# Patient Record
Sex: Female | Born: 1965
Health system: Southern US, Community
[De-identification: ages and names within clinical notes are randomized; demographics above are authoritative.]

## PROBLEM LIST (undated history)

## (undated) DIAGNOSIS — R112 Nausea with vomiting, unspecified: Secondary | ICD-10-CM

## (undated) DIAGNOSIS — R0600 Dyspnea, unspecified: Secondary | ICD-10-CM

## (undated) DIAGNOSIS — M1990 Unspecified osteoarthritis, unspecified site: Secondary | ICD-10-CM

## (undated) DIAGNOSIS — K219 Gastro-esophageal reflux disease without esophagitis: Secondary | ICD-10-CM

## (undated) DIAGNOSIS — Z9889 Other specified postprocedural states: Secondary | ICD-10-CM

## (undated) DIAGNOSIS — D649 Anemia, unspecified: Secondary | ICD-10-CM

## (undated) HISTORY — PX: OTHER SURGICAL HISTORY: SHX169

---

## 2001-01-13 ENCOUNTER — Other Ambulatory Visit: Admission: RE | Admit: 2001-01-13 | Discharge: 2001-01-13 | Payer: Self-pay | Admitting: Obstetrics and Gynecology

## 2002-05-13 ENCOUNTER — Other Ambulatory Visit: Admission: RE | Admit: 2002-05-13 | Discharge: 2002-05-13 | Payer: Self-pay | Admitting: *Deleted

## 2002-05-13 ENCOUNTER — Other Ambulatory Visit: Admission: RE | Admit: 2002-05-13 | Discharge: 2002-05-13 | Payer: Self-pay | Admitting: Obstetrics and Gynecology

## 2002-11-29 ENCOUNTER — Inpatient Hospital Stay (HOSPITAL_COMMUNITY): Admission: AD | Admit: 2002-11-29 | Discharge: 2002-12-02 | Payer: Self-pay | Admitting: Obstetrics and Gynecology

## 2002-11-29 ENCOUNTER — Inpatient Hospital Stay (HOSPITAL_COMMUNITY): Admission: AD | Admit: 2002-11-29 | Discharge: 2002-11-29 | Payer: Self-pay | Admitting: *Deleted

## 2004-12-28 ENCOUNTER — Other Ambulatory Visit: Admission: RE | Admit: 2004-12-28 | Discharge: 2004-12-28 | Payer: Self-pay | Admitting: Obstetrics and Gynecology

## 2005-01-08 ENCOUNTER — Emergency Department (HOSPITAL_COMMUNITY): Admission: EM | Admit: 2005-01-08 | Discharge: 2005-01-08 | Payer: Self-pay | Admitting: Podiatry

## 2005-04-15 ENCOUNTER — Inpatient Hospital Stay (HOSPITAL_COMMUNITY): Admission: AD | Admit: 2005-04-15 | Discharge: 2005-04-15 | Payer: Self-pay | Admitting: Obstetrics and Gynecology

## 2005-04-15 ENCOUNTER — Ambulatory Visit: Payer: Self-pay | Admitting: Obstetrics and Gynecology

## 2005-04-15 ENCOUNTER — Inpatient Hospital Stay (HOSPITAL_COMMUNITY): Admission: AD | Admit: 2005-04-15 | Discharge: 2005-04-17 | Payer: Self-pay | Admitting: Family Medicine

## 2005-12-23 HISTORY — PX: CHOLECYSTECTOMY: SHX55

## 2006-06-17 ENCOUNTER — Ambulatory Visit (HOSPITAL_COMMUNITY): Admission: RE | Admit: 2006-06-17 | Discharge: 2006-06-17 | Payer: Self-pay | Admitting: Gastroenterology

## 2006-06-20 ENCOUNTER — Ambulatory Visit (HOSPITAL_COMMUNITY): Admission: RE | Admit: 2006-06-20 | Discharge: 2006-06-20 | Payer: Self-pay | Admitting: Gastroenterology

## 2006-10-09 ENCOUNTER — Ambulatory Visit (HOSPITAL_COMMUNITY): Admission: RE | Admit: 2006-10-09 | Discharge: 2006-10-10 | Payer: Self-pay | Admitting: General Surgery

## 2006-10-09 ENCOUNTER — Encounter (INDEPENDENT_AMBULATORY_CARE_PROVIDER_SITE_OTHER): Payer: Self-pay | Admitting: Specialist

## 2006-12-23 HISTORY — PX: TUBAL LIGATION: SHX77

## 2007-04-26 ENCOUNTER — Inpatient Hospital Stay (HOSPITAL_COMMUNITY): Admission: AD | Admit: 2007-04-26 | Discharge: 2007-04-29 | Payer: Self-pay | Admitting: Obstetrics and Gynecology

## 2007-04-26 ENCOUNTER — Encounter (INDEPENDENT_AMBULATORY_CARE_PROVIDER_SITE_OTHER): Payer: Self-pay | Admitting: Specialist

## 2007-04-28 ENCOUNTER — Encounter (INDEPENDENT_AMBULATORY_CARE_PROVIDER_SITE_OTHER): Payer: Self-pay | Admitting: Specialist

## 2008-03-14 ENCOUNTER — Ambulatory Visit (HOSPITAL_COMMUNITY): Admission: RE | Admit: 2008-03-14 | Discharge: 2008-03-14 | Payer: Self-pay | Admitting: Neurology

## 2008-04-11 ENCOUNTER — Encounter: Admission: RE | Admit: 2008-04-11 | Discharge: 2008-04-11 | Payer: Self-pay | Admitting: Gastroenterology

## 2008-11-13 ENCOUNTER — Emergency Department (HOSPITAL_COMMUNITY): Admission: EM | Admit: 2008-11-13 | Discharge: 2008-11-13 | Payer: Self-pay | Admitting: Emergency Medicine

## 2009-05-07 ENCOUNTER — Ambulatory Visit (HOSPITAL_COMMUNITY): Admission: RE | Admit: 2009-05-07 | Discharge: 2009-05-07 | Payer: Self-pay | Admitting: Internal Medicine

## 2009-09-01 ENCOUNTER — Ambulatory Visit (HOSPITAL_COMMUNITY): Admission: RE | Admit: 2009-09-01 | Discharge: 2009-09-01 | Payer: Self-pay | Admitting: Gastroenterology

## 2009-10-09 ENCOUNTER — Ambulatory Visit: Payer: Self-pay | Admitting: Radiology

## 2009-10-09 ENCOUNTER — Emergency Department (HOSPITAL_BASED_OUTPATIENT_CLINIC_OR_DEPARTMENT_OTHER): Admission: EM | Admit: 2009-10-09 | Discharge: 2009-10-09 | Payer: Self-pay | Admitting: Emergency Medicine

## 2009-10-29 ENCOUNTER — Emergency Department (HOSPITAL_COMMUNITY): Admission: EM | Admit: 2009-10-29 | Discharge: 2009-10-29 | Payer: Self-pay | Admitting: Emergency Medicine

## 2010-02-14 ENCOUNTER — Encounter: Admission: RE | Admit: 2010-02-14 | Discharge: 2010-02-14 | Payer: Self-pay | Admitting: Obstetrics and Gynecology
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
## 2011-01-13 ENCOUNTER — Encounter: Payer: Self-pay | Admitting: Neurology

## 2011-02-02 ENCOUNTER — Inpatient Hospital Stay (INDEPENDENT_AMBULATORY_CARE_PROVIDER_SITE_OTHER)
Admission: RE | Admit: 2011-02-02 | Discharge: 2011-02-02 | Disposition: A | Discharge status: Home / Self Care | Payer: Self-pay | Source: Ambulatory Visit | Attending: Emergency Medicine | Admitting: Emergency Medicine

## 2011-02-02 DIAGNOSIS — H6690 Otitis media, unspecified, unspecified ear: Secondary | ICD-10-CM

## 2011-04-05 ENCOUNTER — Other Ambulatory Visit: Payer: Self-pay | Admitting: Obstetrics and Gynecology

## 2011-04-05 DIAGNOSIS — Z1231 Encounter for screening mammogram for malignant neoplasm of breast: Secondary | ICD-10-CM

## 2011-04-25 ENCOUNTER — Ambulatory Visit: Payer: Self-pay

## 2011-05-10 NOTE — H&P (Signed)
NAME:  Cheryl Bell, Cheryl Bell                         ACCOUNT NO.:  [id number]   MEDICAL RECORD NO.:  [id number]                   PATIENT TYPE:  MAT   LOCATION:  [facility]                                 FACILITY:  [facility]   PHYSICIAN:  Hal Morales, M.D.             DATE OF BIRTH:  [birth year]   DATE OF ADMISSION:  11/29/2002  DATE OF DISCHARGE:                                HISTORY & PHYSICAL   HISTORY OF PRESENT ILLNESS:  This is a 45 year old gravida 6, para 2-0-3-1  at 40-3/7 weeks, who presents with complaints of increased uterine  contractions over the last two to three hours.  She was seen earlier for  labor evaluation with a cervix stable at 3 cm.   PRESENT PREGNANCY:  Her pregnancy has been followed by Cheryl Bell and  remarkable for:  1. History of macrosomia.  2. History of three abortions.  3. History of migraines.  4. Group B strep positive.  5. History of a child who died of typhoid fever in [place].   Her pregnancy has been otherwise unremarkable, except for a positive Group B  strep.   OBSTETRICAL HISTORY:  Remarkable for vaginal delivery in 1991 of a female  infant at term, weighing 7 pounds 6 ounces with no complications.  She had a  vaginal delivery in 1993 of a female infant at 9+ pounds who later died of  typhoid fever.  She had an elective abortion in an unknown year.  She had an  elective abortion again in 2000, and an elective abortion in 2001; all of  these were between 8 and [redacted] weeks gestation.   PAST MEDICAL HISTORY:  1. History of anemia.  2. History of a questionable hemorrhage with her first delivery.  3. History of hyperemesis with her first pregnancy.  4. History of gonorrhea 12 years ago, with none since.  5. History of varicella in 1993.  6. History of migraine headaches.   PAST SURGICAL HISTORY:  Elective abortions x3.   FAMILY HISTORY:  Father with heart disease and hypertension.  A sister with  hypertension; a sister with a blood clot in her  leg.   GENETIC HISTORY:  Remarkable for the father of the baby being 34 years old.  The father of the baby's uncle with mental retardation.  Father of the  baby's first cousin with sickle cell disease; and, the father of the baby's  uncle who is a twin.   SOCIAL HISTORY:  The patient is single, but involved with Cheryl Bell who  is present and supportive.  She is of the [place] faith.  She denies any  alcohol, tobacco or drug use.   OBJECTIVE DATA:  VITAL SIGNS:  Stable, afebrile.  HEENT:  Within normal limits.  NECK:  Thyroid normal, not enlarged.  CHEST:  Clear to auscultation.  HEART:  Regular rate and rhythm.  ABDOMEN:  Gravid at 40 cm; vertex to  Thayer Ohm.  AFM shows fetal heart rate of  140 baseline, with accelerations to 160.  Uterine contractions every 3-6  mins, which are moderate to strong.  PELVIC:  Cervical examination is 4+ cm, 100% effaced, -1 station with a  vertex presentation and bulging membranes.  There is old brown blood in the  vaginal vault.  EXTREMITIES:  Within normal limits.   ASSESSMENT:  1. Intrauterine pregnancy at 40-3/7 weeks.  2. Active labor.  3. Group B strep positive.   PLAN:  1. Admit to [facility], per Cheryl Bell.  2. Routine MD orders.  3. Penicillin prophylaxis.  4. Anticipate SVD.     Marie L. Opie, C.N.M.                 Hal Morales, M.D.    MLW/MEDQ  D:  11/30/2002  T:  11/30/2002  Job:  [id number]

## 2011-05-10 NOTE — Op Note (Signed)
NAME:  Cheryl Bell, Cheryl Bell               ACCOUNT NO.:  [id number]   MEDICAL RECORD NO.:  [id number]          PATIENT TYPE:  INP   LOCATION:  9110                          FACILITY:  [facility]   PHYSICIAN:  Michelle L. Grewal, M.D.DATE OF BIRTH:  [birth year]   DATE OF PROCEDURE:  04/28/2007  DATE OF DISCHARGE:                               OPERATIVE REPORT   PREOPERATIVE DIAGNOSES:  Postpartum, desires permanent sterilization.   POSTOPERATIVE DIAGNOSES:  Postpartum, desires permanent sterilization.   OPERATIVE PROCEDURE:  Modified Pomeroy bilateral tubal ligation  postpartum.   SURGEON:  Michelle L. Vincente Poli, M.D.   ASSISTANT:  Not applicable.   ANESTHESIA:  Epidural.   SPECIMENS:  Fallopian tube segments.   PATHOLOGY:  Sent to path.   ESTIMATED BLOOD LOSS:  Minimal.   COMPLICATIONS:  None.   DESCRIPTION OF PROCEDURE:  The patient was taken to the operating room  after informed consent was obtained.  She was prepped and draped and her  epidural was dosed and found to be adequate.  A small transverse  incision was made at the umbilicus and then the fascia was identified  and grasped using Allis clamps.  The fascia was entered using Mayo  scissors in standard fashion.  We entered the peritoneum.  I then  identified the right fallopian tube, followed it out to the fimbriated  end, grasped the mid portion using a Babcock clamp and tied off a 3 cm  knuckle x2 with plain gut suture x2.  The knuckle of tissue was excised  using Metzenbaum scissors with excellent hemostasis and it was sent to  pathology.  The tube was returned to the abdomen.  The tubal ligation  was performed on the left side in an identical fashion and that tubal  segment was sent off to pathology.  It appeared grossly that bot tubes  were completely transected.  The fascia was closed using 0 Vicryl in a  running stitch.  The subcuticular was closed with 3-0 Vicryl  subcuticular and Dermabond was applied to the skin.  All  sponge, lap and  instrument counts were correct x2.  The patient went to the recovery  room in stable condition.      Michelle L. Vincente Poli, M.D.  Electronically Signed    MLG/MEDQ  D:  04/28/2007  T:  04/28/2007  Job:  [id number]

## 2011-05-10 NOTE — Op Note (Signed)
NAMEALMA, MOHIUDDIN               ACCOUNT NO.:  [id number]   MEDICAL RECORD NO.:  [id number]          PATIENT TYPE:  OIB   LOCATION:  5712                         FACILITY:  [facility]   PHYSICIAN:  Cherylynn Ridges, M.D.    DATE OF BIRTH:  1966/02/17   DATE OF PROCEDURE:  10/09/2006  DATE OF DISCHARGE:  10/10/2006                                 OPERATIVE REPORT   PREOPERATIVE DIAGNOSIS:  Symptomatic cholelithiasis.   POSTOPERATIVE DIAGNOSIS:  Symptomatic cholelithiasis.   PROCEDURE:  Laparoscopic cholecystectomy with cholangiogram.   SURGEON:  Cherylynn Ridges, M.D.   ASSISTANT:  Anselm Pancoast. Zachery Dakins, M.D.   ANESTHESIA:  General endotracheal.   ESTIMATED BLOOD LOSS:  Less than 20 cc.   COMPLICATIONS:  None.   CONDITION:  Stable.   INDICATIONS FOR OPERATION:  The patient is a 45 year old with symptomatic  gallstones, who now comes in for a lap chole.   FINDINGS:  The cholangiogram was normal with no evidence of ductal  dilatation, filling defects or obstruction of flow.   OPERATION:  The patient was taken to the operating room and placed on the  table in supine position.  After an adequate endotracheal anesthetic was  administered, she was prepped and draped in the usual sterile manner,  exposing the midline in the right upper quadrant.   A supraumbilical curvilinear incision was made using a #11 blade and taken  down to the midline fascia.  We grasped the midline fascia using Kocher  clamps and made an incision between those 2 clamps using a #15 blade down to  the preperitoneal area.  We bluntly dissected down into the peritoneal  cavity with a Kelly clamp, and then passed a pursestring suture of 0 Vicryl,  which held in the Hasson cannula, which was subsequently passed.  With the  Hasson cannula in place, we insufflated with carbon dioxide gas up to a  maximum intra-abdominal pressure of 15 mmHg.  Two right costal margin 5-mm  cannulae, and a subxiphoid 12-mm cannula were  passed under direct vision.  Once all cannulae were in place, the patient was placed in reverse  Trendelenburg. The left side was tilted down and the dissection begun.   The gallbladder was retracted towards the right upper quadrant and the  anterior abdominal wall using a grabber through the lateral-most 5-mm  cannula.  A second grasper was attached onto the infundibulum.  Then, we  dissected out the peritoneum overlying the triangle of Calot and the  hepatoduodenal triangle, exposing the cystic duct and the cystic artery.  The patient had a very prominent right hepatic artery, which was not injured  during the case.   We dissected out the cystic duct and placed a clip along the gallbladder  side.  We made a cholecystodochotomy using laparoscopic scissors, and then  passed a Cook catheter through the anterior abdominal wall into the  cholecystodochotomy in order to perform a cholangiogram.  This showed good  flow into the duodenum, no obstruction, no filling defects and good proximal  filling.   Once the cholangiogram was complete, we removed  it from the cystic duct  distally, placed 3 distal clips along the cystic duct side, then transected  the cystic duct.  Two branches of the cystic artery were subsequently found  in the triangle of Calot, which were clipped proximally and distally and  transected.  We then dissected out the gallbladder from its bed with minimal  difficulty, although we did enter into the gallbladder, spilling bile, but  no stones.   Once the gallbladder was completely removed from the hepatic bed, then we  obtained hemostasis using electrocautery.  We used an EndoCatch bag to  remove the gallbladder from the supraumbilical site with minimal difficulty.  We then irrigated with about 1 L to 1.5 L of warm saline solution, making  sure all bleeding had stopped.  Once this was done, we aspirated above the  liver.  All fluid and gas were removed.  All cannulae were  then closed.   The supraumbilical fascial site was closed using the pursestring suture,  which was in place.  And the skin at all sites was closed using a running,  subcuticular stitch of 4-0 Vicryl.  Marcaine 0.25% with epi was injected at  all sites prior to closure.  A sterile dressing was applied.  All counts  were correct.      Cherylynn Ridges, M.D.  Electronically Signed     JOW/MEDQ  D:  10/09/2006  T:  10/10/2006  Job:  [id number]   cc:   Olene Craven, M.D.  Dr Sharlett Iles. Zachery Dakins, M.D.

## 2011-06-27 ENCOUNTER — Ambulatory Visit
Admission: RE | Admit: 2011-06-27 | Discharge: 2011-06-27 | Disposition: A | Discharge status: Home / Self Care | Payer: Commercial Managed Care - PPO | Source: Ambulatory Visit | Attending: Obstetrics and Gynecology | Admitting: Obstetrics and Gynecology

## 2011-06-27 DIAGNOSIS — Z1231 Encounter for screening mammogram for malignant neoplasm of breast: Secondary | ICD-10-CM

## 2011-09-25 LAB — COMPREHENSIVE METABOLIC PANEL
BUN: 8
CO2: 26
Chloride: 106
Creatinine, Ser: 0.75
GFR calc non Af Amer: >60
Glucose, Bld: 113 — ABNORMAL HIGH
Total Bilirubin: 0.6

## 2011-09-25 LAB — URINALYSIS, ROUTINE W REFLEX MICROSCOPIC
Glucose, UA: NEGATIVE
Hgb urine dipstick: NEGATIVE
Protein, ur: NEGATIVE

## 2011-09-25 LAB — CBC
HCT: 42.2
MCV: 94
Platelets: 178
WBC: 8.2

## 2011-09-25 LAB — DIFFERENTIAL
Basophils Absolute: 0
Lymphocytes Relative: 14
Neutro Abs: 6.4
Neutrophils Relative %: 78 — ABNORMAL HIGH

## 2012-02-24 ENCOUNTER — Other Ambulatory Visit (HOSPITAL_COMMUNITY): Payer: Self-pay | Admitting: Internal Medicine

## 2012-02-28 ENCOUNTER — Other Ambulatory Visit (HOSPITAL_COMMUNITY): Payer: Commercial Managed Care - PPO

## 2012-03-02 ENCOUNTER — Other Ambulatory Visit (HOSPITAL_COMMUNITY): Payer: Commercial Managed Care - PPO

## 2012-03-09 ENCOUNTER — Ambulatory Visit (HOSPITAL_COMMUNITY)
Admission: RE | Admit: 2012-03-09 | Discharge: 2012-03-09 | Disposition: A | Discharge status: Home / Self Care | Payer: 59 | Source: Ambulatory Visit | Attending: Internal Medicine | Admitting: Internal Medicine

## 2012-03-09 DIAGNOSIS — R1310 Dysphagia, unspecified: Secondary | ICD-10-CM | POA: Insufficient documentation

## 2012-06-05 ENCOUNTER — Other Ambulatory Visit: Payer: Self-pay | Admitting: Specialist

## 2012-06-05 ENCOUNTER — Ambulatory Visit
Admission: RE | Admit: 2012-06-05 | Discharge: 2012-06-05 | Disposition: A | Discharge status: Home / Self Care | Payer: 59 | Source: Ambulatory Visit | Attending: Specialist | Admitting: Specialist

## 2012-06-05 DIAGNOSIS — R7611 Nonspecific reaction to tuberculin skin test without active tuberculosis: Secondary | ICD-10-CM

## 2012-08-19 ENCOUNTER — Other Ambulatory Visit: Payer: Self-pay | Admitting: Obstetrics and Gynecology

## 2012-08-19 DIAGNOSIS — Z1231 Encounter for screening mammogram for malignant neoplasm of breast: Secondary | ICD-10-CM

## 2012-08-25 ENCOUNTER — Ambulatory Visit: Payer: 59

## 2012-09-28 ENCOUNTER — Ambulatory Visit
Admission: RE | Admit: 2012-09-28 | Discharge: 2012-09-28 | Disposition: A | Discharge status: Home / Self Care | Payer: 59 | Source: Ambulatory Visit | Attending: Obstetrics and Gynecology | Admitting: Obstetrics and Gynecology

## 2012-09-28 DIAGNOSIS — Z1231 Encounter for screening mammogram for malignant neoplasm of breast: Secondary | ICD-10-CM

## 2012-11-13 ENCOUNTER — Other Ambulatory Visit: Payer: Self-pay | Admitting: Obstetrics and Gynecology

## 2013-07-28 ENCOUNTER — Other Ambulatory Visit (HOSPITAL_BASED_OUTPATIENT_CLINIC_OR_DEPARTMENT_OTHER): Payer: Self-pay | Admitting: Specialist

## 2013-07-28 ENCOUNTER — Ambulatory Visit (HOSPITAL_BASED_OUTPATIENT_CLINIC_OR_DEPARTMENT_OTHER)
Admission: RE | Admit: 2013-07-28 | Discharge: 2013-07-28 | Disposition: A | Discharge status: Home / Self Care | Payer: 59 | Source: Ambulatory Visit | Attending: Specialist | Admitting: Specialist

## 2013-07-28 DIAGNOSIS — A159 Respiratory tuberculosis unspecified: Secondary | ICD-10-CM

## 2013-07-28 DIAGNOSIS — R7611 Nonspecific reaction to tuberculin skin test without active tuberculosis: Secondary | ICD-10-CM | POA: Insufficient documentation

## 2014-01-11 ENCOUNTER — Other Ambulatory Visit: Payer: Self-pay

## 2014-08-10 ENCOUNTER — Other Ambulatory Visit: Payer: Self-pay | Admitting: Obstetrics and Gynecology

## 2014-08-11 LAB — CYTOLOGY - PAP

## 2014-09-16 ENCOUNTER — Ambulatory Visit: Payer: 59 | Admitting: Family

## 2014-09-16 ENCOUNTER — Telehealth: Payer: Self-pay | Admitting: *Deleted

## 2014-09-16 DIAGNOSIS — Z0289 Encounter for other administrative examinations: Secondary | ICD-10-CM

## 2014-09-16 NOTE — Telephone Encounter (Signed)
Please do not reschedule patient with me due to no show.

## 2014-09-16 NOTE — Telephone Encounter (Signed)
Pt did not show for appointment 09/16/2014 at 9am to establish care

## 2014-10-21 ENCOUNTER — Ambulatory Visit: Payer: 59 | Admitting: Medical

## 2015-01-26 ENCOUNTER — Encounter (HOSPITAL_COMMUNITY): Payer: Self-pay | Admitting: *Deleted

## 2015-01-26 ENCOUNTER — Emergency Department (HOSPITAL_COMMUNITY)
Admission: EM | Admit: 2015-01-26 | Discharge: 2015-01-26 | Disposition: A | Discharge status: Home / Self Care | Payer: 59 | Source: Home / Self Care | Attending: Emergency Medicine | Admitting: Emergency Medicine

## 2015-01-26 DIAGNOSIS — B029 Zoster without complications: Secondary | ICD-10-CM

## 2015-01-26 LAB — POCT URINALYSIS DIP (DEVICE)
Bilirubin Urine: NEGATIVE
Glucose, UA: NEGATIVE mg/dL
Hgb urine dipstick: NEGATIVE
KETONES UR: NEGATIVE mg/dL
Leukocytes, UA: NEGATIVE
NITRITE: NEGATIVE
Protein, ur: NEGATIVE mg/dL
Specific Gravity, Urine: 1.01 (ref 1.005–1.030)
UROBILINOGEN UA: 0.2 mg/dL (ref 0.0–1.0)
pH: 6.5 (ref 5.0–8.0)

## 2015-01-26 LAB — POCT PREGNANCY, URINE: PREG TEST UR: NEGATIVE

## 2015-01-26 MED ORDER — VALACYCLOVIR HCL 1 G PO TABS
1000.0000 mg | ORAL_TABLET | Freq: Three times a day (TID) | ORAL | Status: AC
Start: 2015-01-26 — End: 2015-02-09

## 2015-01-26 NOTE — ED Notes (Signed)
Pt  Reports  l  flank  Pain   With  Pain  Radiating  To llq  With  Onset        sev  Days  Ago       Nausea   No  Vomiting  Ambulated  Upright  With a  steady  Fluid  Gait   Alert  And  Oriented

## 2015-01-26 NOTE — Discharge Instructions (Signed)

## 2015-01-26 NOTE — ED Provider Notes (Signed)
   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 She's had a nine-day history of pain in her left flank which has radiated to her left upper quadrant of her abdomen. The pain is constant. It is burning in nature. She has skin sensitivity and the pain is rated a 5/10 in intensity and is worse with movement. She felt slightly nauseated and may have lost a couple pounds. No fever, chills, or vomiting. No constipation, diarrhea, blood in the stool. No urinary or GYN symptoms. She has had chickenpox as a child. She's noted a small patch of itchy bumps in the left CVA area with the pain first began.  Review of Systems   Other than as noted above, the patient denies any of the following symptoms: Constitutional:  No fever, chills, weight loss or anorexia. Abdomen:  No nausea, vomiting, hematememesis, melena, diarrhea, or hematochezia. GU:  No dysuria, frequency, urgency, or hematuria. Gyn:  No vaginal discharge, itching, abnormal bleeding, dyspareunia, or pelvic pain.  [place]   Past medical history, family history, social history, meds, and allergies were reviewed. She is intolerant to Vicodin. She takes meloxicam and Prilosec. She has GERD.  Physical Exam     Vital signs:  BP 116/72 mmHg  Pulse 67  Temp(Src) 98.2 F (36.8 C) (Oral)  Resp 17  SpO2 100%  LMP 01/16/2015 Gen:  Alert, oriented, in no distress. Lungs:  Breath sounds clear and equal bilaterally.  No wheezes, rales or rhonchi. Heart:  Regular rhythm.  No gallops or murmers.   Abdomen:  Soft, flat, nondistended. No organomegaly or mass. Bowel sounds are normal. No tenderness to palpation, guarding, or rebound. No CVA tenderness. Skin:  Clear, warm and dry.  There is a patchy erythematous bumps in the left CVA area, where the pain seems to originate from.  Labs   Results for orders placed or performed during the hospital  encounter of 01/26/15  POCT urinalysis dip (device)  Result Value Ref Range   Glucose, UA NEGATIVE NEGATIVE mg/dL   Bilirubin Urine NEGATIVE NEGATIVE   Ketones, ur NEGATIVE NEGATIVE mg/dL   Specific Gravity, Urine 1.010 1.005 - 1.030   Hgb urine dipstick NEGATIVE NEGATIVE   pH 6.5 5.0 - 8.0   Protein, ur NEGATIVE NEGATIVE mg/dL   Urobilinogen, UA 0.2 0.0 - 1.0 mg/dL   Nitrite NEGATIVE NEGATIVE   Leukocytes, UA NEGATIVE NEGATIVE  Pregnancy, urine POC  Result Value Ref Range   Preg Test, Ur NEGATIVE NEGATIVE   Assessment   The encounter diagnosis was Shingles.  Plan     1.  Meds:  The following meds were prescribed:   New Prescriptions   VALACYCLOVIR (VALTREX) 1000 MG TABLET    Take 1 tablet (1,000 mg total) by mouth 3 (three) times daily.    2.  Patient Education/Counseling:  The patient was given appropriate handouts, self care instructions, and instructed in symptomatic relief.  She was instructed to take infectious precautions.  3.  Follow up:  The patient was told to follow up here if no better in 3 to 4 days, or sooner if becoming worse in any way, and given some red flag symptoms such as worsening pain, fever, vomiting, or evidence of GI bleeding which would prompt immediate return.     Harden Mo, MD 01/26/15 [phone]

## 2016-03-20 MED FILL — traMADol HCL 50 MG TABS: 50 | 4 days supply | Qty: 16 | Fill #0

## 2016-03-20 MED FILL — AMOXICILLIN 500 MG CAPSULE: 500 | 7 days supply | Qty: 21 | Fill #0

## 2017-01-10 MED FILL — AZITHROMYCIN 500 MG TABLET: 500 | 3 days supply | Qty: 3 | Fill #0

## 2017-01-10 MED FILL — ATOVAQUONE-PROGUANIL 250-10: 250-100 | 20 days supply | Qty: 20 | Fill #0

## 2017-01-10 MED FILL — ONDANSETRON HCL 4 MG TABLET: 4 | 4 days supply | Qty: 18 | Fill #0

## 2017-05-28 ENCOUNTER — Other Ambulatory Visit: Payer: Self-pay | Admitting: Internal Medicine

## 2017-05-28 ENCOUNTER — Ambulatory Visit
Admission: RE | Admit: 2017-05-28 | Discharge: 2017-05-28 | Disposition: A | Discharge status: Home / Self Care | Payer: BC Managed Care – PPO | Source: Ambulatory Visit | Attending: Internal Medicine | Admitting: Internal Medicine

## 2017-05-28 DIAGNOSIS — Z9289 Personal history of other medical treatment: Secondary | ICD-10-CM

## 2018-03-13 MED FILL — SM ANTI-DIARRHEAL 2 MG CAPL: 2 | 12 days supply | Qty: 48 | Fill #0

## 2018-03-13 MED FILL — CARAFATE 1 GM/10 ML SUSP: 1 | 11 days supply | Qty: 420 | Fill #0

## 2018-03-13 MED FILL — ONDANSETRON HCL 4 MG TABLET: 4 | 21 days supply | Qty: 18 | Fill #0

## 2018-04-27 ENCOUNTER — Encounter: Payer: Self-pay | Admitting: Nurse Practitioner

## 2018-05-29 NOTE — Progress Notes (Signed)
Office Visit Note  Patient: Cheryl Bell             Date of Birth: 12/11/1966           MRN: [id number]             PCP: Minette Brine Referring: Minette Brine, FNP Visit Date: 06/10/2018 Occupation: RN at [employer]    Subjective:  Pain in multiple joints and positive rheumatoid factor  History of Present Illness: Cheryl Bell is a 52 y.o. female seen in consultation per request of her PCP.  According to patient her symptoms started in March 2016 with increased joint swelling.  At the time her PCP from positive rheumatoid factor and referred her to me.  She came for one time visit at the time she was found to have synovitis.  She did not came to come for follow-up visit after that.  She also had positive TB Gold which was treated at [facility] department according to patient with rifampin.  I do not have those records to confirm.  She states she continued to have joint pain and she avoided certain foods which helped.  She also took over-the-counter anti-inflammatories for pain relief.  She continues to have pain and discomfort in her bilateral wrist joints, bilateral hands, bilateral knee joints, bilateral ankles and her feet.  She states her ankles are mostly swollen in the evening after work.  She works as a Therapist, sports at [employer].  She was recently seen by her PCP and had labs which were positive for rheumatoid factor and she was referred again to me.  Activities of Daily Living:  Patient reports morning stiffness for 10 minutes.   Patient Denies nocturnal pain.  Difficulty dressing/grooming: Denies Difficulty climbing stairs: Denies Difficulty getting out of chair: Denies Difficulty using hands for taps, buttons, cutlery, and/or writing: Denies   Review of Systems  Constitutional: Negative for fatigue.  HENT: Negative for mouth sores, trouble swallowing, trouble swallowing, mouth dryness and nose dryness.   Eyes: Negative for pain, visual disturbance and dryness.  Respiratory:  Negative for cough, hemoptysis, shortness of breath and difficulty breathing.   Cardiovascular: Negative for chest pain, palpitations, hypertension and swelling in legs/feet.  Gastrointestinal: Negative for blood in stool, constipation and diarrhea.  Endocrine: Negative for increased urination.  Genitourinary: Negative for painful urination.  Musculoskeletal: Positive for arthralgias, joint pain, joint swelling and morning stiffness. Negative for myalgias, muscle weakness, muscle tenderness and myalgias.  Skin: Positive for hair loss. Negative for color change, pallor, rash, nodules/bumps, skin tightness, ulcers and sensitivity to sunlight.  Allergic/Immunologic: Negative for susceptible to infections.  Neurological: Negative for dizziness, numbness, headaches and weakness.  Hematological: Negative for swollen glands.  Psychiatric/Behavioral: Negative for depressed mood and sleep disturbance. The patient is not nervous/anxious.     PMFS History:  There are no active problems to display for this patient.   History reviewed. No pertinent past medical history.  Family History  Problem Relation Age of Onset  . Prostate cancer Brother   . Healthy Son   . Healthy Son   . Healthy Daughter   . Healthy Daughter    Past Surgical History:  Procedure Laterality Date  . BTL    . CHOLECYSTECTOMY  2007  . TUBAL LIGATION  2008   Social History   Social History Narrative  . Not on file     Objective: Vital Signs: BP 120/74 (BP Location: Left Arm, Patient Position: Sitting, Cuff Size: Normal)  Pulse (!) 58   Resp 16   Ht 5\' 6"  (1.676 m)   Wt 188 lb 8 oz (85.5 kg)   BMI 30.42 kg/m    Physical Exam  Constitutional: She is oriented to person, place, and time. She appears well-developed and well-nourished.  HENT:  Head: Normocephalic and atraumatic.  Eyes: Conjunctivae and EOM are normal.  Neck: Normal range of motion.  Cardiovascular: Normal rate, regular rhythm, normal heart sounds  and intact distal pulses.  Pulmonary/Chest: Effort normal and breath sounds normal.  Abdominal: Soft. Bowel sounds are normal.  Lymphadenopathy:    She has no cervical adenopathy.  Neurological: She is alert and oriented to person, place, and time.  Skin: Skin is warm and dry. Capillary refill takes less than 2 seconds.  Psychiatric: She has a normal mood and affect. Her behavior is normal.  Nursing note and vitals reviewed.    Musculoskeletal Exam: C-spine thoracic lumbar spine good range of motion.  Shoulder joints elbow joints were good range of motion.  Wrist joints were in good range of motion.  She has synovitis over bilateral second and third MCP joints.  No PIP and DIP swelling was noted.  Hip joints knee joints but good range of motion.  She has limited range of motion of ankle joints with subluxation.  Left MTP was swollen and tender.  CDAI Exam: CDAI Homunculus Exam:   Tenderness:  Right hand: 2nd MCP and 3rd MCP Left hand: 2nd MCP and 3rd MCP RLE: tibiotalar LLE: tibiotalar  Swelling:  Right hand: 2nd MCP and 3rd MCP Left hand: 2nd MCP and 3rd MCP RLE: tibiotalar LLE: tibiotalar  Joint Counts:  CDAI Tender Joint count: 4 CDAI Swollen Joint count: 4  Global Assessments:  Patient Global Assessment: 7 Provider Global Assessment: 5  CDAI Calculated Score: 20    Investigation: Findings:  04/22/18: Uric acid 4.3, RF 337.9, ANA negative, dsDNA <1, C3 168, HIV nonreactive, T pallidum negative, Vit D 23.5, RSh 1.220, TB gold negative, lipids WNL CBC normal, CMP normal  CBC    Component Value Date/Time   WBC 8.2 11/13/2008 1000   RBC 4.49 11/13/2008 1000   HGB 14.3 11/13/2008 1000   HCT 42.2 11/13/2008 1000   PLT 178 11/13/2008 1000   MCV 94.0 11/13/2008 1000   MCHC 33.7 11/13/2008 1000   RDW 12.5 11/13/2008 1000   LYMPHSABS 1.1 11/13/2008 1000   MONOABS 0.6 11/13/2008 1000   EOSABS 0.0 11/13/2008 1000   BASOSABS 0.0 11/13/2008 1000   CMP 11/13/2008    Glucose 113(H)  BUN 8  Creatinine 0.75  Sodium 138  Potassium 3.5  Chloride 106  CO2 26  Calcium 8.9  Total Protein 7.3  Total Bilirubin 0.6  Alkaline Phos 44  AST 21  ALT 18      Imaging: Xr Ankle 2 Views Left  Result Date: 06/10/2018 No tibiotalar joint space narrowing was noted.  Subtalar joint space narrowing was noted.  No erosive changes were noted.  Xr Ankle 2 Views Right  Result Date: 06/10/2018 No tibiotalar joint space narrowing was noted.  Subtalar joint space narrowing was noted.  No erosive changes were noted.  Xr Foot 2 Views Left  Result Date: 06/10/2018 First MTP, PIP and DIP joint space narrowing was noted.  No erosive changes were noted.  No intertarsal joint space narrowing was noted.  Subtalar joint space narrowing was noted. Impression: These findings are consistent with inflammatory arthritis.  Xr Foot 2 Views Right  Result Date:  06/10/2018 First MTP, PIP and DIP joint space narrowing was noted.  No erosive changes were noted.  No intertarsal joint space narrowing was noted.  Subtalar joint space narrowing was noted. Impression: These findings are consistent with inflammatory arthritis.  Xr Hand 2 View Left  Result Date: 06/10/2018 Juxta-articular osteopenia was noted.  Mild third MCP narrowing was noted.  PIP narrowing was noted.  No intercarpal radiocarpal joint space narrowing was noted.  No erosive changes were noted. Impression: These findings were consistent with mild osteoarthritis and rheumatoid arthritis overlap.  Xr Hand 2 View Right  Result Date: 06/10/2018 Juxta-articular osteopenia was noted.  Mild third MCP narrowing was noted.  PIP narrowing was noted.  No intercarpal radiocarpal joint space narrowing was noted.  No erosive changes were noted. Impression: These findings were consistent with mild osteoarthritis and rheumatoid arthritis overlap.  Xr Knee 3 View Left  Result Date: 06/10/2018 Moderate lateral compartment narrowing was  noted.  No chondrocalcinosis was noted.  Mild to moderate with patellofemoral narrowing was noted. Impression: These findings are consistent with moderate osteoarthritis and moderate chondromalacia patella.  Xr Knee 3 View Right  Result Date: 06/10/2018 Moderate lateral compartment narrowing was noted.  No chondrocalcinosis was noted.  Mild to moderate patellofemoral narrowing was noted. Impression: These findings are consistent moderate osteoarthritis and moderate chondromalacia patella.   Speciality Comments: No specialty comments available.    Procedures:  No procedures performed Allergies: Vicodin [hydrocodone-acetaminophen]   Assessment / Plan:     Visit Diagnoses: Rheumatoid arthritis involving multiple sites with positive rheumatoid factor (HCC) - Rheumatoid factor 337.9.  Patient has synovitis in her bilateral MCP joints and ankle joints.  She also had subluxation of the ankle joints.  She has multiple arthralgias.  Detailed counseling regarding rheumatoid arthritis was provided.  Different treatment options and side effects were discussed.  I believe methotrexate would be the ideal choice for her.  I handout on methotrexate was given.  Once we have labs available and she reads about methotrexate we can consider methotrexate at follow-up visit.  Pain in both hands -she has synovitis of her bilateral second and third MCP joints .plan: XR Hand 2 View Right, XR Hand 2 View Left, Cyclic citrul peptide antibody, IgG, Sedimentation rate  Chronic pain of both knees -she had no synovitis on examination.  Plan: XR KNEE 3 VIEW RIGHT, XR KNEE 3 VIEW LEFT.  Patient requested prescription for Voltaren gel.  Side effects was discussed and a prescription was given.  Chronic pain of both ankles -she has subluxation of bilateral ankle joints.  Plan: XR Ankle 2 Views Right, XR Ankle 2 Views Left  Pain in both feet -she has left first MTP swelling.  Plan: XR Foot 2 Views Right, XR Foot 2 Views  Left  High risk medication use - TB: Negative, HIV negative.  Patient was treated for positive TB Gold in 2016 with rifampin per patient.  Chest x-ray June 2018 was negative. - Plan: Hepatitis B core antibody, IgM, Hepatitis B surface antigen, Hepatitis C antibody, Serum protein electrophoresis with reflex, IgG, IgA, IgM  Vitamin D deficiency  History of gastroesophageal reflux (GERD)    Orders: Orders Placed This Encounter  Procedures  . XR Hand 2 View Right  . XR Hand 2 View Left  . XR KNEE 3 VIEW RIGHT  . XR KNEE 3 VIEW LEFT  . XR Ankle 2 Views Right  . XR Ankle 2 Views Left  . XR Foot 2 Views Right  .  XR Foot 2 Views Left  . Cyclic citrul peptide antibody, IgG  . Hepatitis B core antibody, IgM  . Hepatitis B surface antigen  . Hepatitis C antibody  . Serum protein electrophoresis with reflex  . IgG, IgA, IgM  . Sedimentation rate   Meds ordered this encounter  Medications  . diclofenac sodium (VOLTAREN) 1 % GEL    Sig: Apply 3 g to 3 large joints up to 3 times daily    Dispense:  3 Tube    Refill:  3    Follow-Up Instructions: Return for Rheumatoid arthritis.   Bo Merino, MD  Note - This record has been created using Editor, commissioning.  Chart creation errors have been sought, but may not always  have been located. Such creation errors do not reflect on  the standard of medical care.

## 2018-06-10 ENCOUNTER — Ambulatory Visit (INDEPENDENT_AMBULATORY_CARE_PROVIDER_SITE_OTHER): Payer: Self-pay

## 2018-06-10 ENCOUNTER — Ambulatory Visit: Payer: BC Managed Care – PPO | Admitting: Rheumatology

## 2018-06-10 ENCOUNTER — Encounter: Payer: Self-pay | Admitting: Rheumatology

## 2018-06-10 VITALS — BP 120/74 | HR 58 | Resp 16 | Ht 66.0 in | Wt 188.5 lb

## 2018-06-10 DIAGNOSIS — M0579 Rheumatoid arthritis with rheumatoid factor of multiple sites without organ or systems involvement: Secondary | ICD-10-CM | POA: Diagnosis not present

## 2018-06-10 DIAGNOSIS — M25562 Pain in left knee: Secondary | ICD-10-CM | POA: Diagnosis not present

## 2018-06-10 DIAGNOSIS — M79672 Pain in left foot: Secondary | ICD-10-CM

## 2018-06-10 DIAGNOSIS — G8929 Other chronic pain: Secondary | ICD-10-CM

## 2018-06-10 DIAGNOSIS — M79642 Pain in left hand: Secondary | ICD-10-CM

## 2018-06-10 DIAGNOSIS — M25572 Pain in left ankle and joints of left foot: Secondary | ICD-10-CM

## 2018-06-10 DIAGNOSIS — Z8719 Personal history of other diseases of the digestive system: Secondary | ICD-10-CM | POA: Diagnosis not present

## 2018-06-10 DIAGNOSIS — M79641 Pain in right hand: Secondary | ICD-10-CM | POA: Diagnosis not present

## 2018-06-10 DIAGNOSIS — M25561 Pain in right knee: Secondary | ICD-10-CM | POA: Diagnosis not present

## 2018-06-10 DIAGNOSIS — E559 Vitamin D deficiency, unspecified: Secondary | ICD-10-CM | POA: Diagnosis not present

## 2018-06-10 DIAGNOSIS — Z79899 Other long term (current) drug therapy: Secondary | ICD-10-CM | POA: Diagnosis not present

## 2018-06-10 DIAGNOSIS — M79671 Pain in right foot: Secondary | ICD-10-CM

## 2018-06-10 DIAGNOSIS — M25571 Pain in right ankle and joints of right foot: Secondary | ICD-10-CM

## 2018-06-10 MED ORDER — DICLOFENAC SODIUM 1 % TD GEL: TRANSDERMAL | 3 refills | Status: DC

## 2018-06-10 NOTE — Patient Instructions (Signed)
Methotrexate tablets What is this medicine? METHOTREXATE (METH oh TREX ate) is a chemotherapy drug used to treat cancer including breast cancer, leukemia, and lymphoma. This medicine can also be used to treat psoriasis and certain kinds of arthritis. This medicine may be used for other purposes; ask your health care provider or pharmacist if you have questions. COMMON BRAND NAME(S): Rheumatrex, Trexall What should I tell my health care provider before I take this medicine? They need to know if you have any of these conditions: -fluid in the stomach area or lungs -if you often drink alcohol -infection or immune system problems -kidney disease or on hemodialysis -liver disease -low blood counts, like low white cell, platelet, or red cell counts -lung disease -radiation therapy -stomach ulcers -ulcerative colitis -an unusual or allergic reaction to methotrexate, other medicines, foods, dyes, or preservatives -pregnant or trying to get pregnant -breast-feeding How should I use this medicine? Take this medicine by mouth with a glass of water. Follow the directions on the prescription label. Take your medicine at regular intervals. Do not take it more often than directed. Do not stop taking except on your doctor's advice. Make sure you know why you are taking this medicine and how often you should take it. If this medicine is used for a condition that is not cancer, like arthritis or psoriasis, it should be taken weekly, NOT daily. Taking this medicine more often than directed can cause serious side effects, even death. Talk to your healthcare provider about safe handling and disposal of this medicine. You may need to take special precautions. Talk to your pediatrician regarding the use of this medicine in children. While this drug may be prescribed for selected conditions, precautions do apply. Overdosage: If you think you have taken too much of this medicine contact a poison control center or  emergency room at once. NOTE: This medicine is only for you. Do not share this medicine with others. What if I miss a dose? If you miss a dose, talk with your doctor or health care professional. Do not take double or extra doses. What may interact with this medicine? This medicine may interact with the following medication: -acitretin -aspirin and aspirin-like medicines including salicylates -azathioprine -certain antibiotics like penicillins, tetracycline, and chloramphenicol -cyclosporine -gold -hydroxychloroquine -live virus vaccines -NSAIDs, medicines for pain and inflammation, like ibuprofen or naproxen -other cytotoxic agents -penicillamine -phenylbutazone -phenytoin -probenecid -retinoids such as isotretinoin and tretinoin -steroid medicines like prednisone or cortisone -sulfonamides like sulfasalazine and trimethoprim/sulfamethoxazole -theophylline This list may not describe all possible interactions. Give your health care provider a list of all the medicines, herbs, non-prescription drugs, or dietary supplements you use. Also tell them if you smoke, drink alcohol, or use illegal drugs. Some items may interact with your medicine. What should I watch for while using this medicine? Avoid alcoholic drinks. This medicine can make you more sensitive to the sun. Keep out of the sun. If you cannot avoid being in the sun, wear protective clothing and use sunscreen. Do not use sun lamps or tanning beds/booths. You may need blood work done while you are taking this medicine. Call your doctor or health care professional for advice if you get a fever, chills or sore throat, or other symptoms of a cold or flu. Do not treat yourself. This drug decreases your body's ability to fight infections. Try to avoid being around people who are sick. This medicine may increase your risk to bruise or bleed. Call your doctor or health care professional   if you notice any unusual bleeding. Check with your  doctor or health care professional if you get an attack of severe diarrhea, nausea and vomiting, or if you sweat a lot. The loss of too much body fluid can make it dangerous for you to take this medicine. Talk to your doctor about your risk of cancer. You may be more at risk for certain types of cancers if you take this medicine. Both men and women must use effective birth control with this medicine. Do not become pregnant while taking this medicine or until at least 1 normal menstrual cycle has occurred after stopping it. Women should inform their doctor if they wish to become pregnant or think they might be pregnant. Men should not father a child while taking this medicine and for 3 months after stopping it. There is a potential for serious side effects to an unborn child. Talk to your health care professional or pharmacist for more information. Do not breast-feed an infant while taking this medicine. What side effects may I notice from receiving this medicine? Side effects that you should report to your doctor or health care professional as soon as possible: -allergic reactions like skin rash, itching or hives, swelling of the face, lips, or tongue -breathing problems or shortness of breath -diarrhea -dry, nonproductive cough -low blood counts - this medicine may decrease the number of white blood cells, red blood cells and platelets. You may be at increased risk for infections and bleeding. -mouth sores -redness, blistering, peeling or loosening of the skin, including inside the mouth -signs of infection - fever or chills, cough, sore throat, pain or trouble passing urine -signs and symptoms of bleeding such as bloody or black, tarry stools; red or dark-brown urine; spitting up blood or brown material that looks like coffee grounds; red spots on the skin; unusual bruising or bleeding from the eye, gums, or nose -signs and symptoms of kidney injury like trouble passing urine or change in the amount  of urine -signs and symptoms of liver injury like dark yellow or brown urine; general ill feeling or flu-like symptoms; light-colored stools; loss of appetite; nausea; right upper belly pain; unusually weak or tired; yellowing of the eyes or skin Side effects that usually do not require medical attention (report to your doctor or health care professional if they continue or are bothersome): -dizziness -hair loss -tiredness -upset stomach -vomiting This list may not describe all possible side effects. Call your doctor for medical advice about side effects. You may report side effects to FDA at 1-800-FDA-1088. Where should I keep my medicine? Keep out of the reach of children. Store at room temperature between 20 and 25 degrees C (68 and 77 degrees F). Protect from light. Throw away any unused medicine after the expiration date. NOTE: This sheet is a summary. It may not cover all possible information. If you have questions about this medicine, talk to your doctor, pharmacist, or health care provider.  2018 Elsevier/Gold Standard (2015-08-14 05:39:22)  

## 2018-06-12 LAB — PROTEIN ELECTROPHORESIS, SERUM, WITH REFLEX
Albumin ELP: 3.8 g/dL (ref 3.8–4.8)
Alpha 1: 0.3 g/dL (ref 0.2–0.3)
Alpha 2: 0.8 g/dL (ref 0.5–0.9)
Beta 2: 0.5 g/dL (ref 0.2–0.5)
Beta Globulin: 0.5 g/dL (ref 0.4–0.6)
Gamma Globulin: 1.7 g/dL (ref 0.8–1.7)
TOTAL PROTEIN: 7.7 g/dL (ref 6.1–8.1)

## 2018-06-12 LAB — HEPATITIS B CORE ANTIBODY, IGM: Hep B C IgM: NONREACTIVE

## 2018-06-12 LAB — IGG, IGA, IGM
IGG (IMMUNOGLOBIN G), SERUM: 1968 mg/dL — AB (ref 694–1618)
IGM, SERUM: 89 mg/dL (ref 48–271)
Immunoglobulin A: 323 mg/dL (ref 81–463)

## 2018-06-12 LAB — HEPATITIS B SURFACE ANTIGEN: Hepatitis B Surface Ag: NONREACTIVE

## 2018-06-12 LAB — HEPATITIS C ANTIBODY
HEP C AB: NONREACTIVE
SIGNAL TO CUT-OFF: 0.02 (ref <1.00)

## 2018-06-12 LAB — SEDIMENTATION RATE: Sed Rate: 34 mm/h — ABNORMAL HIGH (ref 0–20)

## 2018-06-12 LAB — CYCLIC CITRUL PEPTIDE ANTIBODY, IGG: CYCLIC CITRULLIN PEPTIDE AB: 126 U — AB

## 2018-06-12 NOTE — Progress Notes (Signed)
Labs are consistent with RA.  We will discuss at follow-up visit.

## 2018-06-17 ENCOUNTER — Telehealth: Payer: Self-pay | Admitting: Rheumatology

## 2018-06-17 NOTE — Telephone Encounter (Signed)
Pharmacy has been advised that Diclofenac sodium has been approved.

## 2018-06-17 NOTE — Telephone Encounter (Signed)
A representative from [facility] left a voicemail checking the status of a prior authorization request that was sent on 06/10/18 for Diclofenac Sodium.  Patient has [employer] and they require prior authorization and they are following-up to see if it has been approved.

## 2018-07-29 DIAGNOSIS — Z79899 Other long term (current) drug therapy: Secondary | ICD-10-CM | POA: Insufficient documentation

## 2018-07-29 DIAGNOSIS — M0579 Rheumatoid arthritis with rheumatoid factor of multiple sites without organ or systems involvement: Secondary | ICD-10-CM | POA: Insufficient documentation

## 2018-07-29 DIAGNOSIS — Z8719 Personal history of other diseases of the digestive system: Secondary | ICD-10-CM | POA: Insufficient documentation

## 2018-07-29 DIAGNOSIS — E559 Vitamin D deficiency, unspecified: Secondary | ICD-10-CM | POA: Insufficient documentation

## 2018-07-29 DIAGNOSIS — M170 Bilateral primary osteoarthritis of knee: Secondary | ICD-10-CM | POA: Insufficient documentation

## 2018-07-29 DIAGNOSIS — R7612 Nonspecific reaction to cell mediated immunity measurement of gamma interferon antigen response without active tuberculosis: Secondary | ICD-10-CM | POA: Insufficient documentation

## 2018-07-29 NOTE — Progress Notes (Deleted)
Office Visit Note  Patient: Cheryl Bell             Date of Birth: [birth year]           MRN: [id number]             PCP: Minette Brine Referring: Minette Brine, FNP Visit Date: 08/12/2018 Occupation: _0 @  Subjective:  No chief complaint on file.   History of Present Illness: Cheryl Bell is a 52 y.o. female ***   Activities of Daily Living:  Patient reports morning stiffness for *** {minute/hour:19697}.   Patient {ACTIONS;DENIES/REPORTS:21021675::"Denies"} nocturnal pain.  Difficulty dressing/grooming: {ACTIONS;DENIES/REPORTS:21021675::"Denies"} Difficulty climbing stairs: {ACTIONS;DENIES/REPORTS:21021675::"Denies"} Difficulty getting out of chair: {ACTIONS;DENIES/REPORTS:21021675::"Denies"} Difficulty using hands for taps, buttons, cutlery, and/or writing: {ACTIONS;DENIES/REPORTS:21021675::"Denies"}  No Rheumatology ROS completed.   PMFS History:  Patient Active Problem List   Diagnosis Date Noted  . Rheumatoid arthritis involving multiple sites with positive rheumatoid factor ([place]) 07/29/2018  . High risk medication use 07/29/2018  . Primary osteoarthritis of both knees 07/29/2018  . Positive QuantiFERON-TB Gold test 07/29/2018  . Vitamin D deficiency 07/29/2018  . History of gastroesophageal reflux (GERD) 07/29/2018    No past medical history on file.  Family History  Problem Relation Age of Onset  . Prostate cancer Brother   . Healthy Son   . Healthy Son   . Healthy Daughter   . Healthy Daughter    Past Surgical History:  Procedure Laterality Date  . BTL    . CHOLECYSTECTOMY  2007  . TUBAL LIGATION  2008   Social History   Social History Narrative  . Not on file    Objective: Vital Signs: There were no vitals taken for this visit.   Physical Exam   Musculoskeletal Exam: ***  CDAI Exam: No CDAI exam completed.   Investigation: Findings:  TB: Negative, HIV negative.  Patient was treated for positive TB Gold in 2016 with rifampin  per patient.  Chest x-ray June 2018 was negative.   Imaging: No results found.  Recent Labs: Lab Results  Component Value Date   WBC 8.2 11/13/2008   HGB 14.3 11/13/2008   PLT 178 11/13/2008   NA 138 11/13/2008   K 3.5 11/13/2008   CL 106 11/13/2008   CO2 26 11/13/2008   GLUCOSE 113 (H) 11/13/2008   BUN 8 11/13/2008   CREATININE 0.75 11/13/2008   BILITOT 0.6 11/13/2008   ALKPHOS 44 11/13/2008   AST 21 11/13/2008   ALT 18 11/13/2008   PROT 7.7 06/10/2018   ALBUMIN 3.7 11/13/2008   CALCIUM 8.9 11/13/2008   GFRAA  11/13/2008    >60        The eGFR has been calculated using the MDRD equation. This calculation has not been validated in all clinical  June 10, 2018 SPEP normal, immunoglobulins showed mild elevation of IgG, hepatitis B negative, hepatitis C negative, ESR 34, anti-CCP 126  Speciality Comments: No specialty comments available.  Procedures:  No procedures performed Allergies: Vicodin [hydrocodone-acetaminophen]   Assessment / Plan:     Visit Diagnoses: Rheumatoid arthritis involving multiple sites with positive rheumatoid factor (HCC) - Positive RF, positive anti-CCP, elevated ESR, positive synovitis.  High risk medication use - We discussed methotrexate during the last visit.  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  Chest x-ray June 2018 was negative.  Vitamin D deficiency  History of gastroesophageal reflux (GERD)   Orders:  No orders of the defined types were placed in this encounter.  No orders of the defined types were placed in this encounter.   Face-to-face time spent with patient was *** minutes. Greater than 50% of time was spent in counseling and coordination of care.  Follow-Up Instructions: No follow-ups on file.   Shaili Deveshwar, MD  Note - This record has been created using Dragon software.    Chart creation errors have been sought, but may not always  have been located. Such creation errors do not reflect on  the standard of medical care. 

## 2018-08-12 ENCOUNTER — Ambulatory Visit: Payer: Self-pay | Admitting: Rheumatology

## 2018-12-06 ENCOUNTER — Other Ambulatory Visit: Payer: Self-pay | Admitting: Physician Assistant

## 2018-12-07 NOTE — Telephone Encounter (Signed)
Last Visit: 06/10/18 Next visit was due august 2019. Message sent to the front to schedule patient.   Okay to refill per Dr. Estanislado Pandy

## 2018-12-07 NOTE — Telephone Encounter (Signed)
LMOM for patient to call and schedule NPT FU appointment that was due in August.

## 2018-12-07 NOTE — Telephone Encounter (Signed)
Please schedule patient a follow up visit. Patient was due August 2019. Thanks!

## 2019-03-02 ENCOUNTER — Other Ambulatory Visit (HOSPITAL_COMMUNITY): Payer: Self-pay | Admitting: *Deleted

## 2019-03-03 ENCOUNTER — Encounter (HOSPITAL_COMMUNITY)
Admission: RE | Admit: 2019-03-03 | Discharge: 2019-03-03 | Disposition: A | Discharge status: Home / Self Care | Payer: BC Managed Care – PPO | Source: Ambulatory Visit | Attending: Obstetrics and Gynecology | Admitting: Obstetrics and Gynecology

## 2019-03-03 DIAGNOSIS — D509 Iron deficiency anemia, unspecified: Secondary | ICD-10-CM | POA: Diagnosis present

## 2019-03-03 MED ORDER — SODIUM CHLORIDE 0.9 % IV SOLN
510.0000 mg | INTRAVENOUS | Status: DC
  Administered 2019-03-03: 510 mg via INTRAVENOUS
  Filled 2019-03-03: qty 510

## 2019-03-03 NOTE — Discharge Instructions (Signed)

## 2019-03-03 NOTE — Progress Notes (Signed)
Pt's feraheme scheduled for 3 days apart, but due to the weekend and pts work schedule she will be coming in in one week (next Wednesday.)

## 2019-03-08 ENCOUNTER — Encounter (HOSPITAL_COMMUNITY): Payer: BC Managed Care – PPO

## 2019-03-09 ENCOUNTER — Other Ambulatory Visit: Payer: Self-pay

## 2019-03-10 ENCOUNTER — Other Ambulatory Visit: Payer: Self-pay

## 2019-03-10 ENCOUNTER — Ambulatory Visit (HOSPITAL_COMMUNITY)
Admission: RE | Admit: 2019-03-10 | Discharge: 2019-03-10 | Disposition: A | Discharge status: Home / Self Care | Payer: BC Managed Care – PPO | Source: Ambulatory Visit | Attending: Obstetrics and Gynecology | Admitting: Obstetrics and Gynecology

## 2019-03-10 ENCOUNTER — Encounter (HOSPITAL_COMMUNITY): Payer: BC Managed Care – PPO

## 2019-03-10 ENCOUNTER — Inpatient Hospital Stay (HOSPITAL_COMMUNITY): Admission: RE | Admit: 2019-03-10 | Payer: BC Managed Care – PPO | Source: Ambulatory Visit

## 2019-03-10 DIAGNOSIS — D509 Iron deficiency anemia, unspecified: Secondary | ICD-10-CM | POA: Insufficient documentation

## 2019-03-10 MED ORDER — SODIUM CHLORIDE 0.9 % IV SOLN
510.0000 mg | INTRAVENOUS | Status: AC
  Administered 2019-03-10: 510 mg via INTRAVENOUS
  Filled 2019-03-10: qty 510

## 2019-04-23 NOTE — H&P (Signed)
53 year old female here for TAH. She reports heavy periods lasting 7 days  Hemoglobin was 8.4  Received iron infusion Past Medical History: None  Past surgical history  BTL 2008  Medications Omeprazole  allergies Hydrocodone and prednisone  There were no vitals taken for this visit. No results found for this or any previous visit (from the past 24 hour(s)). General alert and oriented Lung ctab Car RRR Abdomen soft and non tender Pelvic 12 week size fibroids  IMPRESSION: Fibroids  PLAN: [place] Risks reviewed  Consent signed

## 2019-04-27 NOTE — Pre-Procedure Instructions (Signed)
Cheryl Bell  04/27/2019      [id number] - [address], [place] [address] [place] [address] Phone: [phone] Fax: [phone]    Your procedure is scheduled on Monday May 11th.  Report to [facility] Admitting Entrance "A" at 5:30 A.M.  Call this number if you have problems the morning of surgery:  [phone]   Call [phone] if you have any questions prior to your surgery date Monday-Friday 8am-4pm    Remember: Do not eat after midnight. You may drink clear liquids until 4:30. Clear liquids include water, non-citrus juices without pulp, carbonated beverages, clear tea, black coffee and gatorade.     Take these medicines the morning of surgery with A SIP OF WATER  omeprazole (PRILOSEC) cetirizine (ZYRTEC) if needed diphenhydrAMINE (BENADRYL) if needed  As of today,  STOP taking any Aspirin(unless otherwise instructed by your surgeon), Aleve, Naproxen, Ibuprofen, Motrin, Advil, Goody's, BC's, all herbal medications, fish oil, and all vitamins.     Do not wear jewelry, make-up or nail polish.  Do not wear lotions, powders, or perfumes, or deodorant.  Do not shave 48 hours prior to surgery.    Do not bring valuables to the hospital.  [facility] is not responsible for any belongings or valuables.  If you are a smoker, DO NOT smoke 24 hours prior to surgery.  REMEMBER THAT YOU MUST HAVE SOMEONE TO TRANSPORT YOU HOME AFTER YOUR SURGERY, AND REMAIN WITH YOU FOR 24 HOURS IF YOU ARE DISCHARGED THE SAME DAY.    Contacts, dentures or bridgework may not be worn into surgery.  Leave your suitcase in the car.  After surgery it may be brought to your room.  For patients admitted to the hospital, discharge time will be determined by your treatment team.  Patients discharged the day of surgery will not be allowed to drive home.   [place]- Preparing For Surgery  Before surgery, you can play an important role. Because  skin is not sterile, your skin needs to be as free of germs as possible. You can reduce the number of germs on your skin by washing with CHG (chlorahexidine gluconate) Soap before surgery.  CHG is an antiseptic cleaner which kills germs and bonds with the skin to continue killing germs even after washing.    Oral Hygiene is also important to reduce your risk of infection.  Remember - BRUSH YOUR TEETH THE MORNING OF SURGERY WITH YOUR REGULAR TOOTHPASTE  Please do not use if you have an allergy to CHG or antibacterial soaps. If your skin becomes reddened/irritated stop using the CHG.  Do not shave (including legs and underarms) for at least 48 hours prior to first CHG shower. It is OK to shave your face.  Please follow these instructions carefully.   1. Shower the NIGHT BEFORE SURGERY and the MORNING OF SURGERY with CHG.   2. If you chose to wash your hair, wash your hair first as usual with your normal shampoo.  3. After you shampoo, rinse your hair and body thoroughly to remove the shampoo.  4. Use CHG as you would any other liquid soap. You can apply CHG directly to the skin and wash gently with a scrungie or a clean washcloth.   5. Apply the CHG Soap to your body ONLY FROM THE NECK DOWN.  Do not use on open wounds or open sores. Avoid contact with your eyes, ears, mouth and genitals (private parts). Wash Face and  genitals (private parts)  with your normal soap.  6. Wash thoroughly, paying special attention to the area where your surgery will be performed.  7. Thoroughly rinse your body with warm water from the neck down.  8. DO NOT shower/wash with your normal soap after using and rinsing off the CHG Soap.  9. Pat yourself dry with a CLEAN TOWEL.  10. Wear CLEAN PAJAMAS to bed the night before surgery, wear comfortable clothes the morning of surgery  11. Place CLEAN SHEETS on your bed the night of your first shower and DO NOT SLEEP WITH PETS.    Day of Surgery: Shower as stated  above. Do not apply any deodorants/lotions.  Please wear clean clothes to the hospital/surgery center.   Remember to brush your teeth WITH YOUR REGULAR TOOTHPASTE.   Please read over the following fact sheets that you were given.

## 2019-04-28 ENCOUNTER — Encounter: Payer: BC Managed Care – PPO | Admitting: Nurse Practitioner

## 2019-04-28 ENCOUNTER — Encounter (HOSPITAL_COMMUNITY): Payer: Self-pay

## 2019-04-28 ENCOUNTER — Other Ambulatory Visit: Payer: Self-pay

## 2019-04-28 ENCOUNTER — Encounter (HOSPITAL_COMMUNITY)
Admission: RE | Admit: 2019-04-28 | Discharge: 2019-04-28 | Disposition: A | Discharge status: Home / Self Care | Payer: BC Managed Care – PPO | Source: Ambulatory Visit | Attending: Obstetrics and Gynecology | Admitting: Obstetrics and Gynecology

## 2019-04-28 DIAGNOSIS — Z01812 Encounter for preprocedural laboratory examination: Secondary | ICD-10-CM | POA: Diagnosis not present

## 2019-04-28 DIAGNOSIS — D219 Benign neoplasm of connective and other soft tissue, unspecified: Secondary | ICD-10-CM | POA: Diagnosis present

## 2019-04-28 HISTORY — DX: Unspecified osteoarthritis, unspecified site: M19.90

## 2019-04-28 HISTORY — DX: Gastro-esophageal reflux disease without esophagitis: K21.9

## 2019-04-28 HISTORY — DX: Anemia, unspecified: D64.9

## 2019-04-28 HISTORY — DX: Other specified postprocedural states: Z98.890

## 2019-04-28 HISTORY — DX: Nausea with vomiting, unspecified: R11.2

## 2019-04-28 HISTORY — DX: Dyspnea, unspecified: R06.00

## 2019-04-28 LAB — BASIC METABOLIC PANEL
Anion gap: 12 (ref 5–15)
BUN: 5 mg/dL — ABNORMAL LOW (ref 6–20)
CO2: 22 mmol/L (ref 22–32)
Calcium: 8.7 mg/dL — ABNORMAL LOW (ref 8.9–10.3)
Chloride: 107 mmol/L (ref 98–111)
Creatinine, Ser: 0.65 mg/dL (ref 0.44–1.00)
GFR calc Af Amer: >60 mL/min (ref >60)
GFR calc non Af Amer: >60 mL/min (ref >60)
Glucose, Bld: 102 mg/dL — ABNORMAL HIGH (ref 70–99)
Potassium: 3.5 mmol/L (ref 3.5–5.1)
Sodium: 141 mmol/L (ref 135–145)

## 2019-04-28 LAB — ABO/RH: ABO/RH(D): B POS

## 2019-04-28 LAB — CBC
HCT: 34.3 % — ABNORMAL LOW (ref 36.0–46.0)
Hemoglobin: 10.9 g/dL — ABNORMAL LOW (ref 12.0–15.0)
MCH: 29.1 pg (ref 26.0–34.0)
MCHC: 31.8 g/dL (ref 30.0–36.0)
MCV: 91.5 fL (ref 80.0–100.0)
Platelets: 282 10*3/uL (ref 150–400)
RBC: 3.75 MIL/uL — ABNORMAL LOW (ref 3.87–5.11)
RDW: 17.7 % — ABNORMAL HIGH (ref 11.5–15.5)
WBC: 5.1 10*3/uL (ref 4.0–10.5)
nRBC: 0 % (ref 0.0–0.2)

## 2019-04-28 LAB — TYPE AND SCREEN
ABO/RH(D): B POS
Antibody Screen: NEGATIVE

## 2019-04-28 NOTE — Progress Notes (Signed)
Patient will arrive for Covid testing at [facility] [facility] location on Friday at 0800.

## 2019-04-28 NOTE — Final Progress Note (Signed)
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  Reported to [facility]

## 2019-04-30 ENCOUNTER — Other Ambulatory Visit: Payer: Self-pay

## 2019-04-30 ENCOUNTER — Other Ambulatory Visit (HOSPITAL_COMMUNITY)
Admission: RE | Admit: 2019-04-30 | Discharge: 2019-04-30 | Disposition: A | Discharge status: Home / Self Care | Payer: BC Managed Care – PPO | Source: Ambulatory Visit | Attending: Obstetrics and Gynecology | Admitting: Obstetrics and Gynecology

## 2019-04-30 DIAGNOSIS — Z1159 Encounter for screening for other viral diseases: Secondary | ICD-10-CM | POA: Diagnosis not present

## 2019-05-01 LAB — NOVEL CORONAVIRUS, NAA (HOSP ORDER, SEND-OUT TO REF LAB; TAT 18-24 HRS): SARS-CoV-2, NAA: NOT DETECTED

## 2019-05-03 ENCOUNTER — Inpatient Hospital Stay (HOSPITAL_COMMUNITY): Payer: BC Managed Care – PPO | Admitting: Physician Assistant

## 2019-05-03 ENCOUNTER — Encounter (HOSPITAL_COMMUNITY)
Admission: RE | Disposition: A | Discharge status: Home / Self Care | Payer: Self-pay | Source: Home / Self Care | Attending: Obstetrics and Gynecology

## 2019-05-03 ENCOUNTER — Other Ambulatory Visit: Payer: Self-pay

## 2019-05-03 ENCOUNTER — Inpatient Hospital Stay (HOSPITAL_COMMUNITY)
Admission: RE | Admit: 2019-05-03 | Discharge: 2019-05-04 | DRG: 743 | Disposition: A | Discharge status: Home / Self Care | Payer: BC Managed Care – PPO | Attending: Obstetrics and Gynecology | Admitting: Obstetrics and Gynecology

## 2019-05-03 ENCOUNTER — Encounter (HOSPITAL_COMMUNITY): Payer: Self-pay

## 2019-05-03 ENCOUNTER — Inpatient Hospital Stay (HOSPITAL_COMMUNITY): Payer: BC Managed Care – PPO | Admitting: Certified Registered Nurse Anesthetist

## 2019-05-03 DIAGNOSIS — Z79899 Other long term (current) drug therapy: Secondary | ICD-10-CM

## 2019-05-03 DIAGNOSIS — D259 Leiomyoma of uterus, unspecified: Secondary | ICD-10-CM | POA: Diagnosis present

## 2019-05-03 DIAGNOSIS — D509 Iron deficiency anemia, unspecified: Secondary | ICD-10-CM | POA: Diagnosis present

## 2019-05-03 DIAGNOSIS — N920 Excessive and frequent menstruation with regular cycle: Secondary | ICD-10-CM | POA: Diagnosis present

## 2019-05-03 DIAGNOSIS — Z90710 Acquired absence of both cervix and uterus: Secondary | ICD-10-CM | POA: Diagnosis present

## 2019-05-03 HISTORY — PX: ABDOMINAL HYSTERECTOMY: SHX81

## 2019-05-03 LAB — POCT PREGNANCY, URINE: Preg Test, Ur: NEGATIVE

## 2019-05-03 SURGERY — HYSTERECTOMY, ABDOMINAL
Anesthesia: General | Site: Abdomen | Laterality: Left

## 2019-05-03 MED ORDER — BUPIVACAINE HCL (PF) 0.25 % IJ SOLN
INTRAMUSCULAR | Status: AC
  Filled 2019-05-03: qty 30

## 2019-05-03 MED ORDER — BUPIVACAINE HCL (PF) 0.25 % IJ SOLN
INTRAMUSCULAR | Status: DC | PRN
  Administered 2019-05-03: 10 mL

## 2019-05-03 MED ORDER — LIDOCAINE 2% (20 MG/ML) 5 ML SYRINGE
INTRAMUSCULAR | Status: DC | PRN
  Administered 2019-05-03: 60 mg via INTRAVENOUS

## 2019-05-03 MED ORDER — ONDANSETRON HCL 4 MG/2ML IJ SOLN
INTRAMUSCULAR | Status: DC | PRN
  Administered 2019-05-03: 4 mg via INTRAVENOUS

## 2019-05-03 MED ORDER — SODIUM CHLORIDE 0.9 % IV SOLN
2.0000 g | INTRAVENOUS | Status: AC
  Administered 2019-05-03: 2 g via INTRAVENOUS
  Filled 2019-05-03: qty 2

## 2019-05-03 MED ORDER — OXYCODONE HCL 5 MG PO TABS
ORAL_TABLET | ORAL | Status: AC
  Filled 2019-05-03: qty 1

## 2019-05-03 MED ORDER — SUCCINYLCHOLINE CHLORIDE 200 MG/10ML IV SOSY
PREFILLED_SYRINGE | INTRAVENOUS | Status: AC
  Filled 2019-05-03: qty 10

## 2019-05-03 MED ORDER — ONDANSETRON HCL 4 MG/2ML IJ SOLN: 4.0000 mg | Freq: Four times a day (QID) | INTRAMUSCULAR | Status: DC | PRN

## 2019-05-03 MED ORDER — ROCURONIUM BROMIDE 10 MG/ML (PF) SYRINGE
PREFILLED_SYRINGE | INTRAVENOUS | Status: DC | PRN
  Administered 2019-05-03: 50 mg via INTRAVENOUS

## 2019-05-03 MED ORDER — IBUPROFEN 600 MG PO TABS
600.0000 mg | ORAL_TABLET | Freq: Four times a day (QID) | ORAL | Status: DC | PRN
  Administered 2019-05-03 – 2019-05-04 (×2): 600 mg via ORAL
  Filled 2019-05-03 (×2): qty 1

## 2019-05-03 MED ORDER — SCOPOLAMINE 1 MG/3DAYS TD PT72
MEDICATED_PATCH | TRANSDERMAL | Status: DC | PRN
  Administered 2019-05-03: 1 via TRANSDERMAL

## 2019-05-03 MED ORDER — LIDOCAINE 2% (20 MG/ML) 5 ML SYRINGE
INTRAMUSCULAR | Status: AC
  Filled 2019-05-03: qty 5

## 2019-05-03 MED ORDER — MENTHOL 3 MG MT LOZG: 1.0000 | LOZENGE | OROMUCOSAL | Status: DC | PRN

## 2019-05-03 MED ORDER — SUGAMMADEX SODIUM 200 MG/2ML IV SOLN
INTRAVENOUS | Status: DC | PRN
  Administered 2019-05-03: 160 mg via INTRAVENOUS

## 2019-05-03 MED ORDER — FENTANYL CITRATE (PF) 100 MCG/2ML IJ SOLN
25.0000 ug | INTRAMUSCULAR | Status: DC | PRN
  Administered 2019-05-03: 25 ug via INTRAVENOUS
  Administered 2019-05-03: 50 ug via INTRAVENOUS
  Administered 2019-05-03: 25 ug via INTRAVENOUS

## 2019-05-03 MED ORDER — LACTATED RINGERS IV SOLN
INTRAVENOUS | Status: DC
  Administered 2019-05-03 (×2): via INTRAVENOUS

## 2019-05-03 MED ORDER — FENTANYL CITRATE (PF) 250 MCG/5ML IJ SOLN
INTRAMUSCULAR | Status: AC
  Filled 2019-05-03: qty 5

## 2019-05-03 MED ORDER — DEXAMETHASONE SODIUM PHOSPHATE 10 MG/ML IJ SOLN
INTRAMUSCULAR | Status: AC
  Filled 2019-05-03: qty 1

## 2019-05-03 MED ORDER — HYDROMORPHONE HCL 1 MG/ML IJ SOLN: 0.2000 mg | INTRAMUSCULAR | Status: DC | PRN

## 2019-05-03 MED ORDER — SODIUM CHLORIDE 0.9 % IV SOLN
510.0000 mg | Freq: Once | INTRAVENOUS | Status: AC
  Administered 2019-05-03: 510 mg via INTRAVENOUS
  Filled 2019-05-03: qty 17

## 2019-05-03 MED ORDER — HYDROMORPHONE HCL 2 MG PO TABS
2.0000 mg | ORAL_TABLET | ORAL | Status: DC | PRN
  Administered 2019-05-03 (×2): 2 mg via ORAL
  Filled 2019-05-03 (×2): qty 1

## 2019-05-03 MED ORDER — ACETAMINOPHEN 10 MG/ML IV SOLN
INTRAVENOUS | Status: AC
  Filled 2019-05-03: qty 100

## 2019-05-03 MED ORDER — KETOROLAC TROMETHAMINE 30 MG/ML IJ SOLN: 30.0000 mg | Freq: Once | INTRAMUSCULAR | Status: DC

## 2019-05-03 MED ORDER — DEXAMETHASONE SODIUM PHOSPHATE 10 MG/ML IJ SOLN
INTRAMUSCULAR | Status: DC | PRN
  Administered 2019-05-03: 5 mg via INTRAVENOUS

## 2019-05-03 MED ORDER — LACTATED RINGERS IV SOLN
INTRAVENOUS | Status: DC
  Administered 2019-05-03: 07:00:00 via INTRAVENOUS

## 2019-05-03 MED ORDER — EPHEDRINE 5 MG/ML INJ
INTRAVENOUS | Status: AC
  Filled 2019-05-03: qty 10

## 2019-05-03 MED ORDER — PROPOFOL 10 MG/ML IV BOLUS
INTRAVENOUS | Status: AC
  Filled 2019-05-03: qty 20

## 2019-05-03 MED ORDER — PROPOFOL 10 MG/ML IV BOLUS
INTRAVENOUS | Status: DC | PRN
  Administered 2019-05-03: 150 mg via INTRAVENOUS

## 2019-05-03 MED ORDER — OXYCODONE HCL 5 MG/5ML PO SOLN: 5.0000 mg | Freq: Once | ORAL | Status: AC | PRN

## 2019-05-03 MED ORDER — ONDANSETRON HCL 4 MG/2ML IJ SOLN
INTRAMUSCULAR | Status: AC
  Filled 2019-05-03: qty 2

## 2019-05-03 MED ORDER — PHENYLEPHRINE 40 MCG/ML (10ML) SYRINGE FOR IV PUSH (FOR BLOOD PRESSURE SUPPORT)
PREFILLED_SYRINGE | INTRAVENOUS | Status: AC
  Filled 2019-05-03: qty 10

## 2019-05-03 MED ORDER — ONDANSETRON HCL 4 MG PO TABS: 4.0000 mg | ORAL_TABLET | Freq: Four times a day (QID) | ORAL | Status: DC | PRN

## 2019-05-03 MED ORDER — MIDAZOLAM HCL 2 MG/2ML IJ SOLN
INTRAMUSCULAR | Status: DC | PRN
  Administered 2019-05-03 (×2): 1 mg via INTRAVENOUS

## 2019-05-03 MED ORDER — FENTANYL CITRATE (PF) 100 MCG/2ML IJ SOLN
INTRAMUSCULAR | Status: AC
  Filled 2019-05-03: qty 2

## 2019-05-03 MED ORDER — MIDAZOLAM HCL 2 MG/2ML IJ SOLN
INTRAMUSCULAR | Status: AC
  Filled 2019-05-03: qty 2

## 2019-05-03 MED ORDER — FENTANYL CITRATE (PF) 250 MCG/5ML IJ SOLN
INTRAMUSCULAR | Status: DC | PRN
  Administered 2019-05-03: 100 ug via INTRAVENOUS
  Administered 2019-05-03 (×3): 50 ug via INTRAVENOUS

## 2019-05-03 MED ORDER — OXYCODONE HCL 5 MG PO TABS
5.0000 mg | ORAL_TABLET | Freq: Once | ORAL | Status: AC | PRN
  Administered 2019-05-03: 5 mg via ORAL

## 2019-05-03 MED ORDER — ROCURONIUM BROMIDE 10 MG/ML (PF) SYRINGE
PREFILLED_SYRINGE | INTRAVENOUS | Status: AC
  Filled 2019-05-03: qty 10

## 2019-05-03 MED ORDER — ACETAMINOPHEN 10 MG/ML IV SOLN
INTRAVENOUS | Status: DC | PRN
  Administered 2019-05-03: 1000 mg via INTRAVENOUS

## 2019-05-03 SURGICAL SUPPLY — 39 items
ADH SKN CLS APL DERMABOND .7 (GAUZE/BANDAGES/DRESSINGS) ×1
BRR ADH 6X5 SEPRAFILM 1 SHT (MISCELLANEOUS)
CANISTER SUCT 3000ML PPV (MISCELLANEOUS) ×3 IMPLANT
COVER WAND RF STERILE (DRAPES) ×3 IMPLANT
DECANTER SPIKE VIAL GLASS SM (MISCELLANEOUS) IMPLANT
DERMABOND ADVANCED (GAUZE/BANDAGES/DRESSINGS) ×2
DERMABOND ADVANCED .7 DNX12 (GAUZE/BANDAGES/DRESSINGS) ×1 IMPLANT
DRAPE CESAREAN BIRTH W POUCH (DRAPES) ×3 IMPLANT
DRAPE WARM FLUID 44X44 (DRAPE) IMPLANT
DRSG OPSITE POSTOP 4X10 (GAUZE/BANDAGES/DRESSINGS) ×3 IMPLANT
DURAPREP 26ML APPLICATOR (WOUND CARE) ×3 IMPLANT
GAUZE 4X4 16PLY RFD (DISPOSABLE) ×2 IMPLANT
GLOVE BIO SURGEON STRL SZ 6.5 (GLOVE) ×2 IMPLANT
GLOVE BIO SURGEONS STRL SZ 6.5 (GLOVE) ×1
GLOVE BIOGEL PI IND STRL 7.0 (GLOVE) ×2 IMPLANT
GLOVE BIOGEL PI INDICATOR 7.0 (GLOVE) ×4
GOWN STRL REUS W/ TWL LRG LVL3 (GOWN DISPOSABLE) ×3 IMPLANT
GOWN STRL REUS W/TWL LRG LVL3 (GOWN DISPOSABLE) ×9
KIT TURNOVER KIT B (KITS) ×3 IMPLANT
NEEDLE HYPO 22GX1.5 SAFETY (NEEDLE) ×3 IMPLANT
NS IRRIG 1000ML POUR BTL (IV SOLUTION) ×3 IMPLANT
PACK ABDOMINAL GYN (CUSTOM PROCEDURE TRAY) ×3 IMPLANT
PAD ARMBOARD 7.5X6 YLW CONV (MISCELLANEOUS) ×3 IMPLANT
PAD OB MATERNITY 4.3X12.25 (PERSONAL CARE ITEMS) ×3 IMPLANT
SEPRAFILM MEMBRANE 5X6 (MISCELLANEOUS) IMPLANT
SPONGE LAP 18X18 RF (DISPOSABLE) IMPLANT
STAPLER VISISTAT 35W (STAPLE) IMPLANT
SUT PDS AB 0 CT 36 (SUTURE) IMPLANT
SUT PDS AB 0 CTX 60 (SUTURE) IMPLANT
SUT PLAIN 2 0 XLH (SUTURE) IMPLANT
SUT VIC AB 0 CT1 18XCR BRD8 (SUTURE) ×2 IMPLANT
SUT VIC AB 0 CT1 27 (SUTURE) ×12
SUT VIC AB 0 CT1 27XBRD ANBCTR (SUTURE) ×4 IMPLANT
SUT VIC AB 0 CT1 8-18 (SUTURE) ×9
SUT VIC AB 4-0 KS 27 (SUTURE) ×3 IMPLANT
SUT VICRYL 0 TIES 12 18 (SUTURE) ×3 IMPLANT
SYR CONTROL 10ML LL (SYRINGE) ×3 IMPLANT
TOWEL GREEN STERILE FF (TOWEL DISPOSABLE) ×6 IMPLANT
TRAY FOLEY W/BAG SLVR 14FR (SET/KITS/TRAYS/PACK) ×3 IMPLANT

## 2019-05-03 NOTE — Transfer of Care (Signed)
Immediate Anesthesia Transfer of Care Note  Patient: Cheryl Bell  Procedure(s) Performed: HYSTERECTOMY ABDOMINAL with bilateral salpingectomy (Bilateral Abdomen)  Patient Location: PACU  Anesthesia Type:General  Level of Consciousness: drowsy and patient cooperative  Airway & Oxygen Therapy: Patient Spontanous Breathing  Post-op Assessment: Report given to RN, Post -op Vital signs reviewed and stable and Patient moving all extremities X 4  Post vital signs: Reviewed and stable  Last Vitals:  Vitals Value Taken Time  BP 104/68 05/03/2019  8:37 AM  Temp    Pulse 72 05/03/2019  8:37 AM  Resp 10 05/03/2019  8:37 AM  SpO2 94 % 05/03/2019  8:37 AM  Vitals shown include unvalidated device data.  Last Pain:  Vitals:   05/03/19 0602  TempSrc:   PainSc: 0-No pain         Complications: No apparent anesthesia complications

## 2019-05-03 NOTE — Progress Notes (Signed)
The patient appears to being out of bed, sitting in the recliner well.  Call light within reach.

## 2019-05-03 NOTE — Progress Notes (Signed)
H and P on the chart  No changes Will proceed with TAH, BS Consent signed

## 2019-05-03 NOTE — Progress Notes (Signed)
Scant amt pink d/c on pad / changed

## 2019-05-03 NOTE — Progress Notes (Signed)
Patient doing well. Pain but getting IV Dilaudid BP 97/66   Pulse (!) 59   Temp 97.8 F (36.6 C)   Resp 16   Ht 5\' 5"  (1.651 m)   Wt 87 kg   SpO2 99%   BMI 31.92 kg/m  Results for orders placed or performed during the hospital encounter of 05/03/19 (from the past 24 hour(s))  Pregnancy, urine POC     Status: None   Collection Time: 05/03/19  6:12 AM  Result Value Ref Range   Preg Test, Ur NEGATIVE NEGATIVE   Abdomen soft and non tender  POD # 0  Doing well Routine care Discontinue Foley Advance diet

## 2019-05-03 NOTE — Brief Op Note (Signed)
05/03/2019  8:35 AM  PATIENT:  Cheryl Bell  53 y.o. female  PRE-OPERATIVE DIAGNOSIS:  anemia, fibroid, pain, menorrhagia  POST-OPERATIVE DIAGNOSIS:  anemia, fibroid, pain, menorrhagia  PROCEDURE:  Procedure(s): HYSTERECTOMY ABDOMINAL with bilateral salpingectomy (Bilateral)  SURGEON:  Surgeon(s) and Role:    * Dian Queen, MD - Primary Dr Marylynn Pearson  PHYSICIAN ASSISTANT:   ASSISTANTS: none   ANESTHESIA:   general  EBL:  200 mL   BLOOD ADMINISTERED:none  DRAINS: Urinary Catheter (Foley)   LOCAL MEDICATIONS USED:  NONE  SPECIMEN:  Source of Specimen:  cervix, uterus and tubes  DISPOSITION OF SPECIMEN:  PATHOLOGY  COUNTS:  YES  TOURNIQUET:  * No tourniquets in log *  DICTATION: .Other Dictation: Dictation Number dictated  PLAN OF CARE: Admit to inpatient   PATIENT DISPOSITION:  PACU - hemodynamically stable.   Delay start of Pharmacological VTE agent (>24hrs) due to surgical blood loss or risk of bleeding: not applicable

## 2019-05-03 NOTE — Anesthesia Procedure Notes (Signed)
Procedure Name: Intubation Date/Time: 05/03/2019 7:31 AM Performed by: Julieta Bellini, CRNA Pre-anesthesia Checklist: Patient identified, Emergency Drugs available, Suction available and Patient being monitored Patient Re-evaluated:Patient Re-evaluated prior to induction Oxygen Delivery Method: Circle system utilized Preoxygenation: Pre-oxygenation with 100% oxygen Induction Type: IV induction and Rapid sequence Laryngoscope Size: Mac and 3 Grade View: Grade II Tube type: Oral Tube size: 7.0 mm Number of attempts: 1 Airway Equipment and Method: Stylet Placement Confirmation: ETT inserted through vocal cords under direct vision,  positive ETCO2 and breath sounds checked- equal and bilateral Secured at: 22 cm Tube secured with: Tape Dental Injury: Teeth and Oropharynx as per pre-operative assessment

## 2019-05-03 NOTE — Anesthesia Preprocedure Evaluation (Signed)
Anesthesia Evaluation  Patient identified by MRN, date of birth, ID band Patient awake    Reviewed: Allergy & Precautions, H&P , NPO status , Patient's Chart, lab work & pertinent test results  History of Anesthesia Complications (+) PONV and history of anesthetic complications  Airway Mallampati: II   Neck ROM: full    Dental   Pulmonary shortness of breath,    breath sounds clear to auscultation       Cardiovascular negative cardio ROS   Rhythm:regular Rate:Normal     Neuro/Psych    GI/Hepatic GERD  ,  Endo/Other  obese  Renal/GU      Musculoskeletal  (+) Arthritis ,   Abdominal   Peds  Hematology  (+) Blood dyscrasia, anemia ,   Anesthesia Other Findings   Reproductive/Obstetrics                             Anesthesia Physical Anesthesia Plan  ASA: II  Anesthesia Plan: General   Post-op Pain Management:    Induction: Intravenous  PONV Risk Score and Plan: 4 or greater and Ondansetron, Dexamethasone, Midazolam, Scopolamine patch - Pre-op and Treatment may vary due to age or medical condition  Airway Management Planned: Oral ETT  Additional Equipment:   Intra-op Plan:   Post-operative Plan: Extubation in OR  Informed Consent: I have reviewed the patients History and Physical, chart, labs and discussed the procedure including the risks, benefits and alternatives for the proposed anesthesia with the patient or authorized representative who has indicated his/her understanding and acceptance.       Plan Discussed with: Anesthesiologist, CRNA and Surgeon  Anesthesia Plan Comments:         Anesthesia Quick Evaluation

## 2019-05-04 ENCOUNTER — Encounter (HOSPITAL_COMMUNITY): Payer: Self-pay | Admitting: Obstetrics and Gynecology

## 2019-05-04 LAB — CBC
HCT: 27.2 % — ABNORMAL LOW (ref 36.0–46.0)
Hemoglobin: 8.8 g/dL — ABNORMAL LOW (ref 12.0–15.0)
MCH: 29.2 pg (ref 26.0–34.0)
MCHC: 32.4 g/dL (ref 30.0–36.0)
MCV: 90.4 fL (ref 80.0–100.0)
Platelets: 213 10*3/uL (ref 150–400)
RBC: 3.01 MIL/uL — ABNORMAL LOW (ref 3.87–5.11)
RDW: 17.2 % — ABNORMAL HIGH (ref 11.5–15.5)
WBC: 7 10*3/uL (ref 4.0–10.5)
nRBC: 0 % (ref 0.0–0.2)

## 2019-05-04 NOTE — Anesthesia Postprocedure Evaluation (Signed)
Anesthesia Post Note  Patient: Cheryl Bell  Procedure(s) Performed: HYSTERECTOMY ABDOMINAL with left salpingectomy (Left Abdomen)     Patient location during evaluation: PACU Anesthesia Type: General Level of consciousness: awake and alert Pain management: pain level controlled Vital Signs Assessment: post-procedure vital signs reviewed and stable Respiratory status: spontaneous breathing, nonlabored ventilation, respiratory function stable and patient connected to nasal cannula oxygen Cardiovascular status: blood pressure returned to baseline and stable Postop Assessment: no apparent nausea or vomiting Anesthetic complications: no    Last Vitals:  Vitals:   05/03/19 2350 05/04/19 0423  BP: 109/60 (!) 111/59  Pulse: 66 66  Resp: 17 15  Temp: 37.3 C 37 C  SpO2: 98% 97%    Last Pain:  Vitals:   05/04/19 0423  TempSrc: Oral  PainSc:    Pain Goal:                   Priscillia Fouch S

## 2019-05-04 NOTE — Progress Notes (Signed)
Discharge paperwork and instructions given to pt. Pt not in distress and tolerated well. 

## 2019-05-04 NOTE — Plan of Care (Signed)
  Problem: Clinical Measurements: Goal: Will remain free from infection Outcome: Progressing   Problem: Nutrition: Goal: Adequate nutrition will be maintained Outcome: Progressing   

## 2019-05-04 NOTE — Plan of Care (Signed)

## 2019-05-04 NOTE — Discharge Summary (Signed)
Admission Diagnosis: Menorrhagia Anemia Fibroids  Discharge Diagnosis: Same  Hospital Course: 53 year old with fibroids underwent TAH, BS. She did very well postop. On POD # 0, she received Feraheme after the surgery. By POD # 1 , she was voiding, tolerating regular diet and ambulating. She was discharged home on POD # 1 and advised to take Ibuprofen prn pain.  BP 115/75 (BP Location: Right Arm)   Pulse 63   Temp 99.3 F (37.4 C) (Oral)   Resp 15   Ht 5\' 5"  (1.651 m)   Wt 87 kg   SpO2 100%   BMI 31.92 kg/m  Results for orders placed or performed during the hospital encounter of 05/03/19 (from the past 24 hour(s))  CBC     Status: Abnormal   Collection Time: 05/04/19  1:32 AM  Result Value Ref Range   WBC 7.0 4.0 - 10.5 K/uL   RBC 3.01 (L) 3.87 - 5.11 MIL/uL   Hemoglobin 8.8 (L) 12.0 - 15.0 g/dL   HCT 27.2 (L) 36.0 - 46.0 %   MCV 90.4 80.0 - 100.0 fL   MCH 29.2 26.0 - 34.0 pg   MCHC 32.4 30.0 - 36.0 g/dL   RDW 17.2 (H) 11.5 - 15.5 %   Platelets 213 150 - 400 K/uL   nRBC 0.0 0.0 - 0.2 %   Abdomen soft and non tender Incision  Bandage is clean and dry and intact

## 2019-05-05 NOTE — Op Note (Signed)
NAME: Cheryl Bell, Cheryl Bell MEDICAL RECORD [id number] ACCOUNT [id number] DATE OF BIRTH:20-Apr-1966 FACILITY: [facility] LOCATION: [facility] PHYSICIAN:Natania Finigan Lynett Fish, MD  OPERATIVE REPORT  DATE OF PROCEDURE:  05/03/2019  PREOPERATIVE DIAGNOSES:   1.  Menorrhagia 2.  Symptomatic fibroids. 3.  Anemia.  POSTOPERATIVE DIAGNOSES:  1.  Menorrhagia 2.  Symptomatic fibroids. 3.  Anemia.  PROCEDURE:  Total abdominal hysterectomy and bilateral salpingectomy.  SURGEON:  Dian Queen, MD   ASSISTANT:  Kathryne Eriksson  ANESTHESIA:  General.  ESTIMATED BLOOD LOSS:  200.  COMPLICATIONS:  None.  DESCRIPTION OF PROCEDURE:  The patient was taken to the operating room after informed consent was obtained.  She was intubated in the standard fashion and she was prepped and draped and a Foley catheter was inserted.  A low transverse incision was made,  carried down to the fascia.  Fascia was scored in the midline and extended laterally.  The rectus muscles were divided in the midline and the peritoneum was entered bluntly.  Upon entry into the peritoneal cavity, we noted that she had a large myomatous  uterus approximately 14-week size.  It appeared to be a very large grapefruit sized fibroid in her uterus.  We exteriorized the uterus and then we proceeded with the hysterectomy.  Her ovaries were normal and the patient did not want to have her ovaries  removed.  Using a curved Heaney clamp just beneath the mesosalpinx on the right side.  The pedicle was clamped, cut and suture ligated using 0 Vicryl suture.  We then clamped across the triple pedicle on the right side.  Each pedicle was clamped, cut and  suture ligated using 0 Vicryl suture.  This was done identically on the left side in order to preserve the ovaries.  We then developed the bladder flap in the usual fashion, skeletonized uterine arteries and then clamped the uterine artery at the level  of the internal cervical os with curved Heaney clamps.   The pedicles were sutured pedicles were secured using 0 Vicryl suture.  The uterus was then amputated because of the large mass at the body of the uterus and then we proceeded with removal of the  cervix.  We then placed straight Heaney clamps just across the uterosacral cardinal ligaments on either side with careful attention to avoid the bladder and surrounding organs.  Each pedicle was clamped, cut and suture ligated using 0 Vicryl suture.  We  then reached the level of the external os pretty easily and then we removed the cervix after placing curved Heaney clamps across the external os on either side.  The pedicle was identified, clamped, cut and suture ligated using 0 Vicryl suture.  The  cervix was removed completely.  We then closed the vaginal cuff using figure-of-eights using 0 Vicryl suture.  Irrigation was performed.  Hemostasis was noted at all surgical sites.  All instruments and laparotomy pads were removed from the abdominal  cavity.  No self-retaining retractor was used for this case.  We then closed the peritoneum using 0 Vicryl in a running stitch.  The rectus muscles were reapproximated using 0 Vicryl.  The fascia was closed using 0 Vicryl starting in each corner and  meeting in the midline and the subcutaneous tissue was then closed using 3-0 Vicryl in a running fashion.  Steri-Strips and a honeycomb dressing were applied.    The patient tolerated the procedure well and she went to recovery room, extubated in very good condition.  AN/NUANCE  D:05/05/2019 T:05/05/2019 JOB:[id number]

## 2019-09-22 LAB — HM COLONOSCOPY

## 2019-12-24 ENCOUNTER — Other Ambulatory Visit: Payer: Self-pay | Admitting: Rheumatology

## 2020-05-29 LAB — HM PAP SMEAR: HM Pap smear: NEGATIVE

## 2021-11-22 LAB — HM MAMMOGRAPHY

## 2021-11-27 ENCOUNTER — Other Ambulatory Visit: Payer: Self-pay | Admitting: Obstetrics and Gynecology

## 2021-11-27 DIAGNOSIS — N644 Mastodynia: Secondary | ICD-10-CM

## 2021-11-29 ENCOUNTER — Other Ambulatory Visit: Payer: Self-pay | Admitting: Obstetrics and Gynecology

## 2021-11-29 DIAGNOSIS — N644 Mastodynia: Secondary | ICD-10-CM

## 2021-12-07 ENCOUNTER — Ambulatory Visit (INDEPENDENT_AMBULATORY_CARE_PROVIDER_SITE_OTHER): Payer: No Typology Code available for payment source | Admitting: Family

## 2021-12-07 ENCOUNTER — Encounter: Payer: Self-pay | Admitting: Family

## 2021-12-07 ENCOUNTER — Other Ambulatory Visit (HOSPITAL_BASED_OUTPATIENT_CLINIC_OR_DEPARTMENT_OTHER): Payer: Self-pay

## 2021-12-07 VITALS — BP 126/76 | HR 68 | Temp 98.7°F | Ht 65.0 in | Wt 196.6 lb

## 2021-12-07 DIAGNOSIS — Z13220 Encounter for screening for lipoid disorders: Secondary | ICD-10-CM

## 2021-12-07 DIAGNOSIS — R7309 Other abnormal glucose: Secondary | ICD-10-CM | POA: Diagnosis not present

## 2021-12-07 DIAGNOSIS — Z0000 Encounter for general adult medical examination without abnormal findings: Secondary | ICD-10-CM

## 2021-12-07 MED ORDER — DICLOFENAC SODIUM 1 % EX GEL
4.0000 g | Freq: Four times a day (QID) | CUTANEOUS | 1 refills | Status: DC | PRN
  Filled 2021-12-07: qty 100, 7d supply, fill #0

## 2021-12-07 MED ORDER — OMEPRAZOLE 20 MG PO CPDR
20.0000 mg | DELAYED_RELEASE_CAPSULE | Freq: Every day | ORAL | 1 refills | Status: AC
  Filled 2021-12-07: qty 90, 90d supply, fill #0

## 2021-12-07 NOTE — Progress Notes (Signed)
Cheryl Bell is a 55 y.o. female with the following history as recorded in EpicCare:  Patient Active Problem List   Diagnosis Date Noted   S/P TAH (total abdominal hysterectomy) 05/03/2019   Fibroids 04/28/2019   Rheumatoid arthritis involving multiple sites with positive rheumatoid factor (HCC) 07/29/2018   High risk medication use 07/29/2018   Primary osteoarthritis of both knees 07/29/2018   Positive QuantiFERON-TB Gold test 07/29/2018   Vitamin D deficiency 07/29/2018   History of gastroesophageal reflux (GERD) 07/29/2018    Current Outpatient Medications  Medication Sig Dispense Refill   Ascorbic Acid (VITAMIN C) 1000 MG tablet Take 1,000 mg by mouth daily.     b complex vitamins tablet Take 1 tablet by mouth daily.     Biotin 10000 MCG TABS Take 10,000 mcg by mouth daily.     cetirizine (ZYRTEC) 10 MG tablet Take 10 mg by mouth daily as needed for allergies.     Cholecalciferol (VITAMIN D3) 125 MCG (5000 UT) CAPS Take 5,000 Units by mouth daily.     diclofenac sodium (VOLTAREN) 1 % GEL APPLY 3 G TO 3 LARGE JOINTS UP TO 3 TIMES DAILY (Patient taking differently: Apply 3 g topically 3 (three) times daily as needed (pain).) 300 g 3   diclofenac Sodium (VOLTAREN) 1 % GEL Apply 4 grams on to the skin 4 (four) times daily as needed. 100 g 1   diphenhydrAMINE (BENADRYL) 25 MG tablet Take 50 mg by mouth daily as needed for itching.     ibuprofen (ADVIL) 200 MG tablet Take 800 mg by mouth 2 (two) times daily as needed for headache or moderate pain.     Multiple Vitamin (MULTI VITAMIN DAILY PO) Take 1 tablet by mouth daily.      omeprazole (PRILOSEC) 20 MG capsule Take 1 capsule (20 mg total) by mouth daily. 90 capsule 1   vitamin E 400 UNIT capsule Take 400 Units by mouth daily.     No current facility-administered medications for this visit.    Allergies: Prednisone and Vicodin [hydrocodone-acetaminophen]  Past Medical History:  Diagnosis Date   Anemia    Arthritis    Dyspnea     GERD (gastroesophageal reflux disease)    PONV (postoperative nausea and vomiting)     Past Surgical History:  Procedure Laterality Date   ABDOMINAL HYSTERECTOMY Left 05/03/2019   Procedure: HYSTERECTOMY ABDOMINAL with left salpingectomy;  Surgeon: Dian Queen, MD;  Location: [facility];  Service: Gynecology;  Laterality: Left;   BTL     CHOLECYSTECTOMY  2007   TUBAL LIGATION  2008    Family History  Problem Relation Age of Onset   Prostate cancer Brother    Healthy Son    Healthy Son    Healthy Daughter    Healthy Daughter     Social History   Tobacco Use   Smoking status: Never   Smokeless tobacco: Never  Substance Use Topics   Alcohol use: Yes    Comment: occ    Subjective:   Presents today as a new patient; transferring from [facility];  Up to date on mammogram, colonoscopy and pap smear; s/p hysterectomy due to fibroids; History of RA- not under care of rheumatology; made dietary changes and feeling better; requesting refill on Voltaren gel;  Overdue to see eye doctor and dentist- planning to get scheduled;   Review of Systems  Constitutional: Negative.   HENT: Negative.    Eyes: Negative.   Respiratory: Negative.    Cardiovascular:  Negative.   Genitourinary: Negative.   Musculoskeletal: Negative.   Skin: Negative.   Neurological: Negative.   Endo/Heme/Allergies: Negative.   Psychiatric/Behavioral: Negative.       Objective:  Vitals:   12/07/21 1405  BP: 126/76  Pulse: 68  Temp: 98.7 F (37.1 C)  TempSrc: Oral  SpO2: 96%  Weight: 196 lb 9.6 oz (89.2 kg)  Height: '5\' 5"'  (1.651 m)    General: Well developed, well nourished, in no acute distress  Skin : Warm and dry.  Head: Normocephalic and atraumatic  Eyes: Sclera and conjunctiva clear; pupils round and reactive to light; extraocular movements intact  Ears: External normal; canals clear; tympanic membranes normal  Oropharynx: Pink, supple. No suspicious lesions  Neck: Supple without thyromegaly,  adenopathy  Lungs: Respirations unlabored; clear to auscultation bilaterally without wheeze, rales, rhonchi  CVS exam: normal rate and regular rhythm.  Abdomen: Soft; nontender; nondistended; normoactive bowel sounds; no masses or hepatosplenomegaly  Musculoskeletal: No deformities; no active joint inflammation  Extremities: No edema, cyanosis, clubbing  Vessels: Symmetric bilaterally  Neurologic: Alert and oriented; speech intact; face symmetrical; moves all extremities well; CNII-XII intact without focal deficit   Assessment:  1. PE (physical exam), annual   2. Lipid screening   3. Elevated glucose     Plan:  Age appropriate preventive healthcare needs addressed; encouraged regular eye doctor and dental exams; encouraged regular exercise; will update labs and refills as needed today; follow-up to be determined; Continue with GYN; records for pap smear, mammogram and colonoscopy have been requested;  Will hold Shingrix and Prevnar at this time- patient wants to discuss at later time;  Follow up in 1 year, sooner prn.   This visit occurred during the SARS-CoV-2 public health emergency.  Safety protocols were in place, including screening questions prior to the visit, additional usage of staff PPE, and extensive cleaning of exam room while observing appropriate contact time as indicated for disinfecting solutions.    No follow-ups on file.  Orders Placed This Encounter  Procedures   CBC with Differential/Platelet   Comp Met (CMET)   Lipid panel   TSH   Hemoglobin A1c    Requested Prescriptions   Signed Prescriptions Disp Refills   omeprazole (PRILOSEC) 20 MG capsule 90 capsule 1    Sig: Take 1 capsule (20 mg total) by mouth daily.   diclofenac Sodium (VOLTAREN) 1 % GEL 100 g 1    Sig: Apply 4 grams on to the skin 4 (four) times daily as needed.

## 2021-12-10 ENCOUNTER — Telehealth: Payer: Self-pay

## 2021-12-10 NOTE — Telephone Encounter (Signed)
I have received a fax stating the A1c was not run due to the lack of a lavender top tube.   FYI to provider.   Do you know if a purple top was drawn?

## 2021-12-11 LAB — COMPREHENSIVE METABOLIC PANEL
AG Ratio: 1.3 (calc) (ref 1.0–2.5)
ALT: 38 U/L — ABNORMAL HIGH (ref 6–29)
AST: 27 U/L (ref 10–35)
Albumin: 4.3 g/dL (ref 3.6–5.1)
Alkaline phosphatase (APISO): 55 U/L (ref 37–153)
BUN: 10 mg/dL (ref 7–25)
CO2: 25 mmol/L (ref 20–32)
Calcium: 9.5 mg/dL (ref 8.6–10.4)
Chloride: 104 mmol/L (ref 98–110)
Creat: 0.69 mg/dL (ref 0.50–1.03)
Globulin: 3.4 g/dL (calc) (ref 1.9–3.7)
Glucose, Bld: 89 mg/dL (ref 65–99)
Potassium: 4.1 mmol/L (ref 3.5–5.3)
Sodium: 138 mmol/L (ref 135–146)
Total Bilirubin: 0.4 mg/dL (ref 0.2–1.2)
Total Protein: 7.7 g/dL (ref 6.1–8.1)

## 2021-12-11 LAB — LIPID PANEL
Cholesterol: 199 mg/dL (ref <200)
HDL: 46 mg/dL — ABNORMAL LOW (ref >=50)
LDL Cholesterol (Calc): 128 mg/dL (calc) — ABNORMAL HIGH
Non-HDL Cholesterol (Calc): 153 mg/dL (calc) — ABNORMAL HIGH (ref <130)
Total CHOL/HDL Ratio: 4.3 (calc) (ref <5.0)
Triglycerides: 135 mg/dL (ref <150)

## 2021-12-11 LAB — HEMOGLOBIN A1C

## 2021-12-11 LAB — CBC WITH DIFFERENTIAL/PLATELET

## 2021-12-11 LAB — TSH: TSH: 1.34 mIU/L

## 2021-12-12 NOTE — Telephone Encounter (Signed)
On the lab sheet it looks like a lavender was drawn, but called patient and she scheduled for 12/13/21 at 1:45 to repeat lab.

## 2021-12-13 ENCOUNTER — Other Ambulatory Visit: Payer: Self-pay | Admitting: Family

## 2021-12-13 ENCOUNTER — Other Ambulatory Visit: Payer: No Typology Code available for payment source

## 2021-12-13 ENCOUNTER — Encounter: Payer: Self-pay | Admitting: Family

## 2021-12-13 DIAGNOSIS — R7989 Other specified abnormal findings of blood chemistry: Secondary | ICD-10-CM

## 2021-12-13 NOTE — Progress Notes (Signed)
PAP and MAMMOGRAM abstracted.

## 2021-12-13 NOTE — Addendum Note (Signed)
Addended by: Manuela Schwartz on: 12/13/2021 07:43 AM   Modules accepted: Orders

## 2021-12-18 ENCOUNTER — Telehealth: Payer: Self-pay

## 2021-12-18 NOTE — Telephone Encounter (Signed)
Caller states she is looking for lab results.  Telephone: [phone]

## 2021-12-18 NOTE — Telephone Encounter (Signed)
I have called pt and gave her the lab results and we have scheduled her am follow up appt.

## 2021-12-31 ENCOUNTER — Encounter: Payer: Self-pay | Admitting: *Deleted

## 2021-12-31 ENCOUNTER — Other Ambulatory Visit: Payer: BC Managed Care – PPO

## 2022-01-30 ENCOUNTER — Ambulatory Visit
Admission: RE | Admit: 2022-01-30 | Discharge: 2022-01-30 | Disposition: A | Discharge status: Home / Self Care | Payer: No Typology Code available for payment source | Source: Ambulatory Visit | Attending: Obstetrics and Gynecology | Admitting: Obstetrics and Gynecology

## 2022-01-30 DIAGNOSIS — N644 Mastodynia: Secondary | ICD-10-CM

## 2022-03-01 ENCOUNTER — Other Ambulatory Visit (HOSPITAL_BASED_OUTPATIENT_CLINIC_OR_DEPARTMENT_OTHER): Payer: Self-pay

## 2022-03-01 ENCOUNTER — Telehealth (INDEPENDENT_AMBULATORY_CARE_PROVIDER_SITE_OTHER): Payer: No Typology Code available for payment source | Admitting: Family

## 2022-03-01 ENCOUNTER — Encounter: Payer: Self-pay | Admitting: Family

## 2022-03-01 VITALS — Ht 65.0 in | Wt 190.0 lb

## 2022-03-01 DIAGNOSIS — U071 COVID-19: Secondary | ICD-10-CM | POA: Diagnosis not present

## 2022-03-01 MED ORDER — BENZONATATE 100 MG PO CAPS
100.0000 mg | ORAL_CAPSULE | Freq: Three times a day (TID) | ORAL | 0 refills | Status: DC | PRN
  Filled 2022-03-01: qty 20, 7d supply, fill #0

## 2022-03-01 MED ORDER — MOLNUPIRAVIR EUA 200MG CAPSULE
4.0000 | ORAL_CAPSULE | Freq: Two times a day (BID) | ORAL | 0 refills | Status: AC
  Filled 2022-03-01: qty 40, 5d supply, fill #0

## 2022-03-01 MED ORDER — ONDANSETRON HCL 8 MG PO TABS
8.0000 mg | ORAL_TABLET | Freq: Three times a day (TID) | ORAL | 0 refills | Status: DC | PRN
  Filled 2022-03-01: qty 20, 7d supply, fill #0

## 2022-03-01 NOTE — Progress Notes (Signed)
? ?Cheryl Bell is a 56 y.o. female with the following history as recorded in EpicCare:  ?Patient Active Problem List  ? Diagnosis Date Noted  ? S/P TAH (total abdominal hysterectomy) 05/03/2019  ? Fibroids 04/28/2019  ? Rheumatoid arthritis involving multiple sites with positive rheumatoid factor ([place]) 07/29/2018  ? High risk medication use 07/29/2018  ? Primary osteoarthritis of both knees 07/29/2018  ? Positive QuantiFERON-TB Gold test 07/29/2018  ? Vitamin D deficiency 07/29/2018  ? History of gastroesophageal reflux (GERD) 07/29/2018  ?  ?Current Outpatient Medications  ?Medication Sig Dispense Refill  ? Ascorbic Acid (VITAMIN C) 1000 MG tablet Take 1,000 mg by mouth daily.    ? b complex vitamins tablet Take 1 tablet by mouth daily.    ? benzonatate (TESSALON) 100 MG capsule Take 1 capsule (100 mg total) by mouth 3 (three) times daily as needed. 20 capsule 0  ? Biotin 10000 MCG TABS Take 10,000 mcg by mouth daily.    ? cetirizine (ZYRTEC) 10 MG tablet Take 10 mg by mouth daily as needed for allergies.    ? Cholecalciferol (VITAMIN D3) 125 MCG (5000 UT) CAPS Take 5,000 Units by mouth daily.    ? diclofenac sodium (VOLTAREN) 1 % GEL APPLY 3 G TO 3 LARGE JOINTS UP TO 3 TIMES DAILY (Patient taking differently: Apply 3 g topically 3 (three) times daily as needed (pain).) 300 g 3  ? diclofenac Sodium (VOLTAREN) 1 % GEL Apply 4 grams on to the skin 4 (four) times daily as needed. 100 g 1  ? diphenhydrAMINE (BENADRYL) 25 MG tablet Take 50 mg by mouth daily as needed for itching.    ? ibuprofen (ADVIL) 200 MG tablet Take 800 mg by mouth 2 (two) times daily as needed for headache or moderate pain.    ? molnupiravir EUA (LAGEVRIO) 200 mg CAPS capsule Take 4 capsules (800 mg total) by mouth 2 (two) times daily for 5 days. 40 capsule 0  ? Multiple Vitamin (MULTI VITAMIN DAILY PO) Take 1 tablet by mouth daily.     ? omeprazole (PRILOSEC) 20 MG capsule Take 1 capsule (20 mg total) by mouth daily. 90 capsule 1  ?  ondansetron (ZOFRAN) 8 MG tablet Take 1 tablet (8 mg total) by mouth every 8 (eight) hours as needed for nausea or vomiting. 20 tablet 0  ? vitamin E 400 UNIT capsule Take 400 Units by mouth daily.    ? ?No current facility-administered medications for this visit.  ?  ?Allergies: Prednisone and Vicodin [hydrocodone-acetaminophen]  ?Past Medical History:  ?Diagnosis Date  ? Anemia   ? Arthritis   ? Dyspnea   ? GERD (gastroesophageal reflux disease)   ? PONV (postoperative nausea and vomiting)   ?  ?Past Surgical History:  ?Procedure Laterality Date  ? ABDOMINAL HYSTERECTOMY Left 05/03/2019  ? Procedure: HYSTERECTOMY ABDOMINAL with left salpingectomy;  Surgeon: Dian Queen, MD;  Location: [facility];  Service: Gynecology;  Laterality: Left;  ? BTL    ? CHOLECYSTECTOMY  2007  ? TUBAL LIGATION  2008  ?  ?Family History  ?Problem Relation Age of Onset  ? Prostate cancer Brother   ? Healthy Son   ? Healthy Son   ? Healthy Daughter   ? Healthy Daughter   ?  ?Social History  ? ?Tobacco Use  ? Smoking status: Never  ? Smokeless tobacco: Never  ?Substance Use Topics  ? Alcohol use: Yes  ?  Comment: occ  ?  ?Subjective:  ? ? ?  I connected with Danie Chandler on 03/01/22 at 11:00 AM EST by a telephone call and verified that I am speaking with the correct person using two identifiers. ?  ?I discussed the limitations of evaluation and management by telemedicine and the availability of in person appointments. The patient expressed understanding and agreed to proceed. ?Provider in office/ patient is at home; provider and patient are only 2 people on telephone call.  ? ?Started on Wednesday with cough/ congestion/ body aches/ fatigue; tested positive for COVID on Wednesday; no chest pain or shortness of breath but notes she is having some nausea this morning;  ? ? ?Objective:  ?Vitals:  ? 03/01/22 1052  ?Weight: 190 lb (86.2 kg)  ?Height: '5\' 5"'$  (1.651 m)  ?  ?Lungs: Respirations unlabored;  ?Neurologic: Alert and oriented; speech  intact;  ? ?1. COVID-19   ?  ?Plan:  ?Rx for Molnupiravir, Tessalon Perles and Zofran; increase fluids, rest and symptomatic treatment discussed; she will check in with [facility] [employer] to discuss her return to work date; follow up worse, no better;  ? ?Time spent 10 minutes ? ?No follow-ups on file.  ?No orders of the defined types were placed in this encounter. ?  ?Requested Prescriptions  ? ?Signed Prescriptions Disp Refills  ? molnupiravir EUA (LAGEVRIO) 200 mg CAPS capsule 40 capsule 0  ?  Sig: Take 4 capsules (800 mg total) by mouth 2 (two) times daily for 5 days.  ? benzonatate (TESSALON) 100 MG capsule 20 capsule 0  ?  Sig: Take 1 capsule (100 mg total) by mouth 3 (three) times daily as needed.  ? ondansetron (ZOFRAN) 8 MG tablet 20 tablet 0  ?  Sig: Take 1 tablet (8 mg total) by mouth every 8 (eight) hours as needed for nausea or vomiting.  ?  ? ?

## 2022-04-09 ENCOUNTER — Ambulatory Visit: Payer: No Typology Code available for payment source | Admitting: Family

## 2022-04-18 ENCOUNTER — Ambulatory Visit: Payer: No Typology Code available for payment source | Admitting: Family

## 2022-04-30 ENCOUNTER — Ambulatory Visit (INDEPENDENT_AMBULATORY_CARE_PROVIDER_SITE_OTHER): Payer: No Typology Code available for payment source | Admitting: Family

## 2022-04-30 ENCOUNTER — Encounter: Payer: Self-pay | Admitting: Family

## 2022-04-30 ENCOUNTER — Other Ambulatory Visit (HOSPITAL_BASED_OUTPATIENT_CLINIC_OR_DEPARTMENT_OTHER): Payer: Self-pay

## 2022-04-30 VITALS — BP 110/70 | HR 68 | Temp 97.9°F | Ht 65.0 in | Wt 205.2 lb

## 2022-04-30 DIAGNOSIS — R7611 Nonspecific reaction to tuberculin skin test without active tuberculosis: Secondary | ICD-10-CM

## 2022-04-30 DIAGNOSIS — R7989 Other specified abnormal findings of blood chemistry: Secondary | ICD-10-CM

## 2022-04-30 DIAGNOSIS — G4730 Sleep apnea, unspecified: Secondary | ICD-10-CM | POA: Diagnosis not present

## 2022-04-30 DIAGNOSIS — R5383 Other fatigue: Secondary | ICD-10-CM

## 2022-04-30 DIAGNOSIS — E538 Deficiency of other specified B group vitamins: Secondary | ICD-10-CM

## 2022-04-30 DIAGNOSIS — R0683 Snoring: Secondary | ICD-10-CM

## 2022-04-30 DIAGNOSIS — R7309 Other abnormal glucose: Secondary | ICD-10-CM | POA: Diagnosis not present

## 2022-04-30 MED ORDER — DICLOFENAC SODIUM 1 % EX GEL
4.0000 g | Freq: Four times a day (QID) | CUTANEOUS | 1 refills | Status: DC | PRN
  Filled 2022-04-30: qty 100, 7d supply, fill #0
  Filled 2022-09-06: qty 100, 7d supply, fill #1

## 2022-04-30 NOTE — Progress Notes (Signed)
?Cheryl Bell is a 56 y.o. female with the following history as recorded in EpicCare:  ?Patient Active Problem List  ? Diagnosis Date Noted  ? S/P TAH (total abdominal hysterectomy) 05/03/2019  ? Fibroids 04/28/2019  ? Rheumatoid arthritis involving multiple sites with positive rheumatoid factor ([place]) 07/29/2018  ? High risk medication use 07/29/2018  ? Primary osteoarthritis of both knees 07/29/2018  ? Positive QuantiFERON-TB Gold test 07/29/2018  ? Vitamin D deficiency 07/29/2018  ? History of gastroesophageal reflux (GERD) 07/29/2018  ?  ?Current Outpatient Medications  ?Medication Sig Dispense Refill  ? Ascorbic Acid (VITAMIN C) 1000 MG tablet Take 1,000 mg by mouth daily.    ? b complex vitamins tablet Take 1 tablet by mouth daily.    ? Biotin 10000 MCG TABS Take 10,000 mcg by mouth daily.    ? cetirizine (ZYRTEC) 10 MG tablet Take 10 mg by mouth daily as needed for allergies.    ? Cholecalciferol (VITAMIN D3) 125 MCG (5000 UT) CAPS Take 5,000 Units by mouth daily.    ? Multiple Vitamin (MULTI VITAMIN DAILY PO) Take 1 tablet by mouth daily.     ? omeprazole (PRILOSEC) 20 MG capsule Take 1 capsule (20 mg total) by mouth daily. 90 capsule 1  ? vitamin E 400 UNIT capsule Take 400 Units by mouth daily.    ? diclofenac Sodium (VOLTAREN) 1 % GEL Apply 4 grams on to the skin 4 (four) times daily as needed. 100 g 1  ? diphenhydrAMINE (BENADRYL) 25 MG tablet Take 50 mg by mouth daily as needed for itching. (Patient not taking: Reported on 04/30/2022)    ? ibuprofen (ADVIL) 200 MG tablet Take 800 mg by mouth 2 (two) times daily as needed for headache or moderate pain. (Patient not taking: Reported on 04/30/2022)    ? ?No current facility-administered medications for this visit.  ?  ?Allergies: Prednisone and Vicodin [hydrocodone-acetaminophen]  ?Past Medical History:  ?Diagnosis Date  ? Anemia   ? Arthritis   ? Dyspnea   ? GERD (gastroesophageal reflux disease)   ? PONV (postoperative nausea and vomiting)   ?  ?Past  Surgical History:  ?Procedure Laterality Date  ? ABDOMINAL HYSTERECTOMY Left 05/03/2019  ? Procedure: HYSTERECTOMY ABDOMINAL with left salpingectomy;  Surgeon: Dian Queen, MD;  Location: [facility];  Service: Gynecology;  Laterality: Left;  ? BTL    ? CHOLECYSTECTOMY  2007  ? TUBAL LIGATION  2008  ?  ?Family History  ?Problem Relation Age of Onset  ? Prostate cancer Brother   ? Healthy Son   ? Healthy Son   ? Healthy Daughter   ? Healthy Daughter   ?  ?Social History  ? ?Tobacco Use  ? Smoking status: Never  ? Smokeless tobacco: Never  ?Substance Use Topics  ? Alcohol use: Yes  ?  Comment: occ  ?  ?Subjective:  ? ?Presents to follow up on fatigue; needs to get labs updated; patient's labs were not processed in December 2022 and patient wanted to wait until April 2023 to get re-checked; ?Asks if she can get scheduled for sleep study- known history of snoring/ has been told she stops breathing when sleeping; ? 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  ? ? ? ?Objective:  ?Vitals:  ? 04/30/22 1524  ?BP:  110/70  ?Pulse: 68  ?Temp: 97.9 ?F (36.6 ?C)  ?SpO2: 98%  ?Weight: 205 lb 3.2 oz (93.1 kg)  ?Height: _0  (1.651 m)  ?  ?General: Well developed, well nourished, in no acute distress  ?Skin : Warm and dry.  ?Head: Normocephalic and atraumatic  ?Eyes: Sclera and conjunctiva clear; pupils round and reactive to light; extraocular movements intact  ?Ears: External normal; canals clear; tympanic membranes normal  ?Oropharynx: Pink, supple. No suspicious lesions  ?Neck: Supple without thyromegaly, adenopathy  ?Lungs: Respirations unlabored; clear to auscultation bilaterally without wheeze, rales, rhonchi  ?CVS exam: normal rate and regular rhythm.  ?Neurologic: Alert and oriented; speech intact; face symmetrical; moves all extremities well;  CNII-XII intact without focal deficit  ?Assessment:  ?1. Other fatigue   ?2. Elevated glucose   ?3. Low vitamin B12 level   ?4. Observed sleep apnea   ?5. Snoring   ?6. PPD positive   ?  ?Plan:  ?Update labs today;  ?Check Hgba1c today;  ?Check B12 level; ?Refer for sleep study;  ?6.   Check Quantiferon gold test per patient;  ? ?No follow-ups on file.  ?Orders Placed This Encounter  ?Procedures  ? CBC with Differential/Platelet  ? Comp Met (CMET)  ? Hemoglobin A1c  ? TSH  ? B12  ? QuantiFERON-TB Gold Plus  ? Ambulatory referral to Neurology  ?  Referral Priority:   Routine  ?  Referral Type:   Consultation  ?  Referral Reason:   Specialty Services Required  ?  Requested Specialty:   Neurology  ?  Number of Visits Requested:   1  ?  ?Requested Prescriptions  ? ?Signed Prescriptions Disp Refills  ? diclofenac Sodium (VOLTAREN) 1 % GEL 100 g 1  ?  Sig: Apply 4 grams on to the skin 4 (four) times daily as needed.  ?  ? ?

## 2022-05-01 LAB — CBC WITH DIFFERENTIAL/PLATELET
Basophils Absolute: 0 10*3/uL (ref 0.0–0.1)
Basophils Relative: 0.7 % (ref 0.0–3.0)
Eosinophils Absolute: 0.3 10*3/uL (ref 0.0–0.7)
Eosinophils Relative: 4.8 % (ref 0.0–5.0)
HCT: 38.6 % (ref 36.0–46.0)
Hemoglobin: 13 g/dL (ref 12.0–15.0)
Lymphocytes Relative: 47.7 % — ABNORMAL HIGH (ref 12.0–46.0)
Lymphs Abs: 2.9 10*3/uL (ref 0.7–4.0)
MCHC: 33.8 g/dL (ref 30.0–36.0)
MCV: 93.4 fl (ref 78.0–100.0)
Monocytes Absolute: 0.5 10*3/uL (ref 0.1–1.0)
Monocytes Relative: 9 % (ref 3.0–12.0)
Neutro Abs: 2.3 10*3/uL (ref 1.4–7.7)
Neutrophils Relative %: 37.8 % — ABNORMAL LOW (ref 43.0–77.0)
Platelets: 245 10*3/uL (ref 150.0–400.0)
RBC: 4.13 Mil/uL (ref 3.87–5.11)
RDW: 12.8 % (ref 11.5–15.5)
WBC: 6 10*3/uL (ref 4.0–10.5)

## 2022-05-01 LAB — COMPREHENSIVE METABOLIC PANEL
ALT: 34 U/L (ref 0–35)
AST: 30 U/L (ref 0–37)
Albumin: 4.1 g/dL (ref 3.5–5.2)
Alkaline Phosphatase: 50 U/L (ref 39–117)
BUN: 11 mg/dL (ref 6–23)
CO2: 26 mEq/L (ref 19–32)
Calcium: 9 mg/dL (ref 8.4–10.5)
Chloride: 103 mEq/L (ref 96–112)
Creatinine, Ser: 0.72 mg/dL (ref 0.40–1.20)
GFR: 95.11 mL/min (ref >60.00)
Glucose, Bld: 86 mg/dL (ref 70–99)
Potassium: 4.2 mEq/L (ref 3.5–5.1)
Sodium: 137 mEq/L (ref 135–145)
Total Bilirubin: 0.4 mg/dL (ref 0.2–1.2)
Total Protein: 7.2 g/dL (ref 6.0–8.3)

## 2022-05-01 LAB — HEMOGLOBIN A1C: Hgb A1c MFr Bld: 5.8 % (ref 4.6–6.5)

## 2022-05-01 LAB — VITAMIN B12: Vitamin B-12: 434 pg/mL (ref 211–911)

## 2022-05-01 LAB — TSH: TSH: 2.85 u[IU]/mL (ref 0.35–5.50)

## 2022-05-03 LAB — QUANTIFERON-TB GOLD PLUS
Mitogen-NIL: >10 IU/mL
NIL: 0.04 IU/mL
QuantiFERON-TB Gold Plus: NEGATIVE
TB1-NIL: 0.25 IU/mL
TB2-NIL: 0.23 IU/mL

## 2022-05-09 ENCOUNTER — Encounter: Payer: Self-pay | Admitting: Family

## 2022-06-24 ENCOUNTER — Encounter: Payer: Self-pay | Admitting: Neurology

## 2022-06-24 ENCOUNTER — Ambulatory Visit (INDEPENDENT_AMBULATORY_CARE_PROVIDER_SITE_OTHER): Payer: No Typology Code available for payment source | Admitting: Neurology

## 2022-06-24 VITALS — BP 121/73 | HR 64 | Ht 65.0 in | Wt 204.4 lb

## 2022-06-24 DIAGNOSIS — R0683 Snoring: Secondary | ICD-10-CM

## 2022-06-24 DIAGNOSIS — E669 Obesity, unspecified: Secondary | ICD-10-CM | POA: Diagnosis not present

## 2022-06-24 DIAGNOSIS — G4730 Sleep apnea, unspecified: Secondary | ICD-10-CM

## 2022-06-24 DIAGNOSIS — R0681 Apnea, not elsewhere classified: Secondary | ICD-10-CM

## 2022-06-24 DIAGNOSIS — G4719 Other hypersomnia: Secondary | ICD-10-CM

## 2022-06-24 DIAGNOSIS — R351 Nocturia: Secondary | ICD-10-CM

## 2022-06-24 NOTE — Patient Instructions (Signed)

## 2022-06-24 NOTE — Progress Notes (Signed)
Subjective:    Patient ID: Cheryl Bell is a 56 y.o. female.  HPI    Cheryl Age, MD, PhD [facility] [address] [place], [place] [address]  Dear Cheryl Bell,  I saw your patient, Cheryl Bell, upon your kind request in my sleep clinic today for initial consultation of her sleep disorder, in particular, concern for underlying obstructive sleep apnea.  The patient is unaccompanied today.  As you know, Cheryl Bell is a 56 year old right-handed woman with an underlying medical history of fibroids, status post TAH, rheumatoid arthritis, osteoarthritis, vitamin D deficiency, reflux disease, anemia, and obesity, who reports snoring and excessive daytime somnolence, as well as witnessed apneas.  I reviewed your office note from 04/30/2022.  Her Epworth sleepiness score is 17 out of 24, fatigue severity score is 34 out of 63.  His snoring is loud per significant other.  Patient has woken up with a sense of gasping, panic and palpitations at times.  She denies recurrent morning or nocturnal headaches but does have nocturia about twice per average night.  She works as an Therapist, sports, mostly inpatient and some outpatient also.  She works dayshift primarily, 7 AM to 7 PM.  She lives with her younger 3 children.  She has an older son who is married and has a family of his own.  She has a 54 year old, 54 year old and 54 year old in the home.  She does have a TV in the bedroom but currently is not using it.  She has reduced her caffeine and sugar intake in the past month or so.  She limits herself to 1 cup of coffee if at all.  She does not drink alcohol on a regular basis.  She is a non-smoker.  She goes to bed around 10 PM and rise time is around 530.  She has gained weight over the past 3 years in the realm of 30 pounds.  She is working on weight loss now.    Her Past Medical History Is Significant For: Past Medical History:  Diagnosis Date   Anemia    Arthritis    Dyspnea     GERD (gastroesophageal reflux disease)    PONV (postoperative nausea and vomiting)     Her Past Surgical History Is Significant For: Past Surgical History:  Procedure Laterality Date   ABDOMINAL HYSTERECTOMY Left 05/03/2019   Procedure: HYSTERECTOMY ABDOMINAL with left salpingectomy;  Surgeon: Cheryl Queen, MD;  Location: [facility];  Service: Gynecology;  Laterality: Left;   BTL     CHOLECYSTECTOMY  2007   TUBAL LIGATION  2008    Her Family History Is Significant For: Family History  Problem Relation Bell of Onset   Prostate cancer Brother    Prostate cancer Brother    Healthy Daughter    Healthy Daughter    Healthy Son    Healthy Son    Sleep apnea Neg Hx     Her Social History Is Significant For: Social History   Socioeconomic History   Marital status: Single    Spouse name: Not on file   Number of children: Not on file   Years of education: Not on file   Highest education level: Not on file  Occupational History   Not on file  Tobacco Use   Smoking status: Never   Smokeless tobacco: Never  Vaping Use   Vaping Use: Never used  Substance and Sexual Activity   Alcohol use: Yes    Comment: occ  Drug use: Never   Sexual activity: Not on file  Other Topics Concern   Not on file  Social History Narrative   Not on file   Social Determinants of Health   Financial Resource Strain: Not on file  Food Insecurity: Not on file  Transportation Needs: Not on file  Physical Activity: Not on file  Stress: Not on file  Social Connections: Not on file    Her Allergies Are:  Allergies  Allergen Reactions   Prednisone Palpitations   Vicodin [Hydrocodone-Acetaminophen] Nausea And Vomiting  :   Her Current Medications Are:  Outpatient Encounter Medications as of 06/24/2022  Medication Sig   Ascorbic Acid (VITAMIN C) 1000 MG tablet Take 1,000 mg by mouth daily.   b complex vitamins tablet Take 1 tablet by mouth daily.   Biotin 10000 MCG TABS Take 10,000 mcg by  mouth daily.   cetirizine (ZYRTEC) 10 MG tablet Take 10 mg by mouth daily as needed for allergies.   Cholecalciferol (VITAMIN D3) 125 MCG (5000 UT) CAPS Take 5,000 Units by mouth daily.   diclofenac Sodium (VOLTAREN) 1 % GEL Apply 4 grams on to the skin 4 (four) times daily as needed.   diphenhydrAMINE (BENADRYL) 25 MG tablet Take 50 mg by mouth as needed for itching.   ibuprofen (ADVIL) 200 MG tablet Take 800 mg by mouth as needed for headache or moderate pain.   Multiple Vitamin (MULTI VITAMIN DAILY PO) Take 1 tablet by mouth daily.    omeprazole (PRILOSEC) 20 MG capsule Take 1 capsule (20 mg total) by mouth daily.   Turmeric (QC TUMERIC COMPLEX PO) Take by mouth in the morning and at bedtime.   vitamin E 400 UNIT capsule Take 400 Units by mouth daily.   No facility-administered encounter medications on file as of 06/24/2022.  :   Review of Systems:  Out of a complete 14 point review of systems, all are reviewed and negative with the exception of these symptoms as listed below:  Review of Systems  Neurological:        Pt here for sleep consult   Pt snores,fatigue . Pt denies  headaches,sleep study ,and CPAP machine '   ESS:17 FSS:34    Objective:  Neurological Exam  Physical Exam Physical Examination:   Vitals:   06/24/22 0922  BP: 121/73  Pulse: 64    General Examination: The patient is a very pleasant 56 y.o. female in no acute distress. She appears well-developed and well-nourished and well groomed.   HEENT: Normocephalic, atraumatic, pupils are equal, round and reactive to light, extraocular tracking is good without limitation to gaze excursion or nystagmus noted. Hearing is grossly intact. Face is symmetric with normal facial animation. Speech is clear with no dysarthria noted. There is no hypophonia. There is no lip, neck/head, jaw or voice tremor. Neck is supple with full range of passive and active motion. There are no carotid bruits on auscultation. Oropharynx exam  reveals: mild mouth dryness, adequate dental hygiene and good airway crowding secondary to small airway entry and thicker soft palate, tonsillar size of about 1+, uvula on the smaller side overall.  Wider tongue or tongue noted.  Mallampati class III.  Neck circumference 15 3/8 inches.  Minimal overbite.  Tongue protrudes centrally and palate elevates symmetrically.   Chest: Clear to auscultation without wheezing, rhonchi or crackles noted.  Heart: S1+S2+0, regular and normal without murmurs, rubs or gallops noted.   Abdomen: Soft, non-tender and non-distended.  Extremities: There is no  pitting edema in the distal lower extremities bilaterally.   Skin: Warm and dry without trophic changes noted.   Musculoskeletal: exam reveals no obvious joint deformities.   Neurologically:  Mental status: The patient is awake, alert and oriented in all 4 spheres. Her immediate and remote memory, attention, language skills and fund of knowledge are appropriate. There is no evidence of aphasia, agnosia, apraxia or anomia. Speech is clear with normal prosody and enunciation. Thought process is linear. Mood is normal and affect is normal.  Cranial nerves II - XII are as described above under HEENT exam.  Motor exam: Normal bulk, strength and tone is noted. There is no tremor, Romberg is negative. Reflexes are 2+ throughout. Fine motor skills and coordination: grossly intact.  Cerebellar testing: No dysmetria or intention tremor. There is no truncal or gait ataxia.  Sensory exam: intact to light touch in the upper and lower extremities.  Gait, station and balance: She stands easily. No veering to one side is noted. No leaning to one side is noted. Posture is Bell-appropriate and stance is narrow based. Gait shows normal stride length and normal pace. No problems turning are noted.   Assessment and Plan:    In summary, Cheryl Bell is a very pleasant 56 y.o.-year old female with an underlying medical history of  fibroids, status post TAH, rheumatoid arthritis, osteoarthritis, vitamin D deficiency, reflux disease, anemia, and obesity, who history and physical exam are concerning for sleep disordered breathing, supporting a current working diagnosis of unspecified sleep apnea, with the main differential diagnoses of obstructive sleep apnea (OSA) versus upper airway resistance syndrome (UARS) versus central sleep apnea (CSA), or mixed sleep apnea. A laboratory attended sleep study is considered gold standard for evaluation of sleep disordered breathing and is recommended at this time and clinically justified.   I had a long chat with the patient about my findings and the diagnosis of sleep apnea, particularly OSA, its prognosis and treatment options. We talked about medical/conservative treatments, surgical interventions and non-pharmacological approaches for symptom control. I explained, in particular, the risks and ramifications of untreated moderate to severe OSA, especially with respect to developing cardiovascular disease down the road, including congestive heart failure (CHF), difficult to treat hypertension, cardiac arrhythmias (particularly A-fib), neurovascular complications including TIA, stroke and dementia. Even type 2 diabetes has, in part, been linked to untreated OSA. Symptoms of untreated OSA may include (but may not be limited to) daytime sleepiness, nocturia (i.e. frequent nighttime urination), memory problems, mood irritability and suboptimally controlled or worsening mood disorder such as depression and/or anxiety, lack of energy, lack of motivation, physical discomfort, as well as recurrent headaches, especially morning or nocturnal headaches. We talked about the importance of maintaining a healthy lifestyle and striving for healthy weight. I recommended the following at this time: sleep study.  I outlined the differences between a laboratory attended sleep study which is considered more comprehensive and  accurate over the option of a home sleep test (HST); the latter may lead to underestimation of sleep disordered breathing in some instances and does not help with diagnosing upper airway resistance syndrome and is not accurate enough to diagnose primary central sleep apnea typically. I explained the different sleep test procedures to the patient in detail and also outlined possible surgical and non-surgical treatment options of OSA, including the use of a pressure airway pressure (PAP) device (ie CPAP, AutoPAP/APAP or BiPAP in certain circumstances), a custom-made dental device (aka oral appliance, which would require a referral to  a specialist dentist or orthodontist typically, and is generally speaking not considered a good choice for patients with full dentures or edentulous state), upper airway surgical options, such as traditional UPPP (which is not considered a first-line treatment) or the Inspire device (hypoglossal nerve stimulator, which would involve a referral for consultation with an ENT surgeon, after careful selection, following inclusion criteria). I explained the PAP treatment option to the patient in detail, as this is generally considered first-line treatment.  The patient indicated that she would be willing to try PAP therapy, if the need arises. I explained the importance of being compliant with PAP treatment, not only for insurance purposes but primarily to improve patient's symptoms symptoms, and for the patient's long term health benefit, including to reduce Her cardiovascular risks longer-term.   We will pick up our discussion about the next steps and treatment options after testing.  We will keep her posted as to the test results by phone call and/or MyChart messaging where possible.  We will plan to follow-up in sleep clinic accordingly as well.  I answered all her questions today and the patient and was in agreement.   I encouraged her to call with any interim questions, concerns,  problems or updates or email [place] through [facility].  Generally speaking, sleep test authorizations may take up to 2 weeks, sometimes less, sometimes longer, the patient is encouraged to get in touch with [place] if they do not hear back from the sleep lab staff directly within the next 2 weeks.  Thank you very much for allowing me to participate in the care of this nice patient. If I can be of any further assistance to you please do not hesitate to call me at [phone].  Sincerely,   Cheryl Age, MD, PhD

## 2022-07-15 ENCOUNTER — Telehealth: Payer: Self-pay | Admitting: Neurology

## 2022-07-15 NOTE — Telephone Encounter (Signed)
[facility] no auth req spoke to [facility] ref # [id number].  Patient is scheduled at [facility] for 08/08/22 at 9 pm. I mailed packet to the patient.

## 2022-08-08 ENCOUNTER — Ambulatory Visit (INDEPENDENT_AMBULATORY_CARE_PROVIDER_SITE_OTHER): Payer: No Typology Code available for payment source | Admitting: Neurology

## 2022-08-08 DIAGNOSIS — G4720 Circadian rhythm sleep disorder, unspecified type: Secondary | ICD-10-CM

## 2022-08-08 DIAGNOSIS — R351 Nocturia: Secondary | ICD-10-CM

## 2022-08-08 DIAGNOSIS — R0683 Snoring: Secondary | ICD-10-CM

## 2022-08-08 DIAGNOSIS — G4730 Sleep apnea, unspecified: Secondary | ICD-10-CM

## 2022-08-08 DIAGNOSIS — G4719 Other hypersomnia: Secondary | ICD-10-CM

## 2022-08-08 DIAGNOSIS — E669 Obesity, unspecified: Secondary | ICD-10-CM

## 2022-08-08 DIAGNOSIS — R0681 Apnea, not elsewhere classified: Secondary | ICD-10-CM

## 2022-08-20 NOTE — Procedures (Signed)
[facility] POLYSOMNOGRAPHY  INTERPRETATION REPORT   STUDY DATE:  08/08/2022     PATIENT NAME:  Cheryl Bell         DATE OF BIRTH:  1966/04/30  PATIENT ID:  [id number]    TYPE OF STUDY:  PSG  READING PHYSICIAN: Star Age, MD, PhD SCORING TECHNICIAN: Gaylyn Cheers   INDICATIONS: 56 year old right-handed woman with an underlying medical history of fibroids, status post TAH, rheumatoid arthritis, osteoarthritis, vitamin D deficiency, reflux disease, anemia, and obesity, who reports snoring and excessive daytime somnolence, as well as witnessed apneas. The Epworth Sleepiness Scale was 17 out of 24 (scores above or equal to 10 are suggestive of hypersomnolence).  ADDITIONAL INFORMATION:  Height: 65 in Weight: 204 lb (BMI 33) Neck Size: 15 in Medications: Vitamin C, B-Complex vitamins, Biotin, Zyrtec, Vitamin D3, Voltaren, Benadryl, Advil, Multivitamin, Prilosec, turmeric, Vitamin E DESCRIPTION: A sleep technologist was in attendance for the duration of the recording.  Data collection, scoring, video monitoring, and reporting were performed in compliance with the AASM Manual for the Scoring of Sleep and Associated Events; (Hypopnea is scored based on the criteria listed in Section VIII D. 1b in the AASM Manual V2.6 using a 4% oxygen desaturation rule or Hypopnea is scored based on the criteria listed in Section VIII D. 1a in the AASM Manual V2.6 using 3% oxygen desaturation and /or arousal rule).  A physician certified by the American Board of Sleep Medicine reviewed each epoch of the study.  FINDINGS:  Please refer to the attached summary for additional quantitative information.   SLEEP CONTINUITY AND SLEEP ARCHITECTURE:  Lights out was at 22:12: and lights on 05:20: (7.1 hours in bed). Total sleep time was 368.5 minutes (42.3% supine;  57.7% lateral;  0% prone, 18.5% REM sleep), with a normal sleep efficiency at 86.1%. Sleep latency was normal at 7.5 minutes.   REM sleep latency was mildly reduced at 64.5 minutes. Of the total sleep time, the percentage of stage N1 sleep was 4.1%, stage N2 sleep was 62%, which is increased, stage N3 sleep was 15.7%, which is normal, and REM sleep was 18.5%, which is near-normal. There were 5 Stage R periods observed on this study night, 21 awakenings (i.e. transitions to Stage W from any sleep stage), and 74 total stage transitions. Wake after sleep onset (WASO) time accounted for 49 minutes with mild sleep fragmentation noted and one longer period of wakefulness. The patient took one bathroom break.  AROUSAL: There were 77 arousals in total, for an arousal index of 13 arousals/hour.  Of these, 6 were identified as respiratory-related arousals (1 /hr), 0 were PLM-related arousals (0 /hr), and 69 were non-specific arousals (11 /hr)  RESPIRATORY MONITORING:  Based on CMS criteria (using a 4% oxygen desaturation rule for scoring hypopneas), there were 0 apneas (0 obstructive; 0 central; 0 mixed), and 10 hypopneas.  Apnea index was 0.0. Hypopnea index was 1.6. The apnea-hypopnea index was 1.6 overall (3.8 supine, 0 non-supine; 8.8 REM, 21.1 supine REM). There were 0 respiratory effort-related arousals (RERAs).  The RERA index was 0 events/hr. Total respiratory disturbance index (RDI) was 1.6 events/hr. RDI results showed: supine RDI  3.8 /hr; non-supine RDI 0.0 /hr; REM RDI 8.8 /hr, supine REM RDI 21.1 /hr.   Based on AASM criteria (using a 3% oxygen desaturation and /or arousal rule for scoring hypopneas), there were 0 apneas (0 obstructive; 0 central; 0 mixed), and 10 hypopneas. Apnea index was 0.0. Hypopnea index was 1.6.  The apnea-hypopnea index was 1.6/hour overall (3.8 supine, 0 non-supine; 8.8 REM, 21.1 supine REM). There were 0 respiratory effort-related arousals (RERAs).  The RERA index was 0 events/hr. Total respiratory disturbance index (RDI) was 1.6 events/hr. RDI results showed: supine RDI  3.8 /hr; non-supine RDI 0.0 /hr;  REM RDI 8.8 /hr, supine REM RDI 21.1 /hr.  Respiratory events were associated with oxyhemoglobin desaturations (nadir during sleep 88%) from a mean of 94%). There were 0 occurrences of Cheyne Stokes breathing.   OXIMETRY: Total sleep time spent at, or below 88% was 0.2 minutes, or 0.1% of total sleep time. Snoring was classified as intermittent and mild to moderate. BODY POSITION: Duration of total sleep and percent of total sleep in their respective position is as follows: supine 156 minutes (42%), non-supine 213 minutes (58%); right 119 minutes (32%), left 93 minutes (25%), and prone 00 minutes (0%). Total supine REM sleep time was 28 minutes (42% of total REM sleep).  LIMB MOVEMENTS: There were 0 periodic limb movements of sleep (0.0/hr), of which 0 (0.0/hr) were associated with an arousal. EEG: With the limited montage recorded, no EEG abnormalities were observed. CARDIAC: The electrocardiogram showed normal sinus rhythm.  The average heart rate during sleep was 60 bpm.  The maximum heart rate during sleep was 83 bpm. The maximum heart rate during recording was 93.  BEHAVIORAL: No significant parasomnia behavior noted. The video and audio analysis did not show any abnormal or unusual behaviors, movements, phonations or vocalizations. Post study, the patient indicated, that sleep was worse than usual.  IMPRESSION and DIAGNOSES:  Primary Snoring 2.   Dysfunctions associated with sleep stages or arousal from sleep  RECOMMENDATIONS:  1. This study does not demonstrate any significant obstructive or central sleep disordered breathing with an AHI of less than 5/hour (1.63/hour) and oxygen saturations at or above 88% for the night. Mild to moderate intermittent snoring was noted. Treatment with a positive airway pressure device, such as CPAP or autoPAP is not indicated. Weight loss and avoidance of the supine sleep position may aid in reducing her snoring.  2.   This study shows some sleep  fragmentation and mildly abnormal sleep stage percentages; these are nonspecific findings and per se do not signify an intrinsic sleep disorder or a cause for the patient's sleep-related symptoms. Causes include (but are not limited to) the first night effect of the sleep study, circadian rhythm disturbances, medication effect or an underlying mood disorder or medical problem.  3.   The patient should be cautioned not to drive, work at heights, or operate dangerous or heavy equipment when tired or sleepy. Review and reiteration of good sleep hygiene measures should be pursued with any patient. 4.   The patient will be advised to follow up with the referring provider, who will be notified of the test results.   I certify that I have reviewed the entire raw data recording prior to the issuance of this report in accordance with the Standards of Accreditation of the American Academy of Sleep Medicine (AASM).  Star Age,  MD, PhD

## 2022-08-27 ENCOUNTER — Telehealth: Payer: Self-pay

## 2022-08-27 NOTE — Telephone Encounter (Signed)
I called Cheryl Bell and relayed to her  Dr. Guadelupe Sabin response to having HST.  Cheryl Bell said ok.

## 2022-08-27 NOTE — Telephone Encounter (Signed)
I called patient. I discussed her sleep study results and recommendations.  She reports that she did not sleep well during the NPSG. She would like to do an HST instead. I discussed with her the limitations of an HST and the possibility that insurance may deny coverage of an HST since she has already had an NPSG but she would still like to pursue this. She feels that she would sleep better in her home environment. She wakes up in a panic and she said that the only way to capture these episodes is if she is sleeping well at home. I advised her that I will ask Dr. Rexene Alberts if an HST is feasible. Pt verbalized understanding.

## 2022-08-27 NOTE — Telephone Encounter (Signed)
-----   Message from Star Age, MD sent at 08/20/2022 11:02 AM EDT ----- Patient referred by PCP, seen by me on 06/24/22, diagnostic PSG on 08/08/22.   Please call and notify the patient that the recent sleep study did not show any significant obstructive sleep apnea. She did have intermittent mild to moderate snoring. Overall AHI was 1.63/hour and oxygen saturations remained at or above 88% for the night. Treatment with a positive airway pressure device, such as CPAP or autoPAP is not indicated. Weight loss and avoidance of the supine sleep position may aid in reducing her snoring. Please inform patient that she can follow-up with referring provider at this point. She had recent other blood tests, which were fine. Given her sleepiness reported, I recommend she get her vit D level checked as well by PCP at the next opportunity.   Thanks,  Star Age, MD, PhD [facility])

## 2022-08-27 NOTE — Telephone Encounter (Signed)
I would not recommend a home sleep test as we had reliable sleep data during her laboratory attended sleep study which is the more reliable, concise, comprehensive and accurate test, to home sleep test.  In fact, she slept 86% of the time tested which is very decent.  She achieved all stages of sleep.  At this juncture, I really do not have enough to justify repeating her sleep study with a home sleep test.  Please explain this again to patient.

## 2022-09-06 ENCOUNTER — Other Ambulatory Visit (HOSPITAL_BASED_OUTPATIENT_CLINIC_OR_DEPARTMENT_OTHER): Payer: Self-pay

## 2022-10-10 ENCOUNTER — Ambulatory Visit: Payer: No Typology Code available for payment source | Admitting: Family

## 2022-10-15 ENCOUNTER — Ambulatory Visit (HOSPITAL_BASED_OUTPATIENT_CLINIC_OR_DEPARTMENT_OTHER)
Admission: RE | Admit: 2022-10-15 | Discharge: 2022-10-15 | Disposition: A | Discharge status: Home / Self Care | Payer: No Typology Code available for payment source | Source: Ambulatory Visit | Attending: Family | Admitting: Family

## 2022-10-15 ENCOUNTER — Encounter: Payer: Self-pay | Admitting: Family

## 2022-10-15 ENCOUNTER — Ambulatory Visit (INDEPENDENT_AMBULATORY_CARE_PROVIDER_SITE_OTHER): Payer: No Typology Code available for payment source | Admitting: Family

## 2022-10-15 VITALS — BP 118/62 | HR 70 | Temp 98.3°F | Ht 65.0 in | Wt 204.8 lb

## 2022-10-15 DIAGNOSIS — R221 Localized swelling, mass and lump, neck: Secondary | ICD-10-CM | POA: Diagnosis not present

## 2022-10-15 DIAGNOSIS — R053 Chronic cough: Secondary | ICD-10-CM | POA: Diagnosis present

## 2022-10-15 NOTE — Progress Notes (Signed)
Cheryl Bell is a 56 y.o. female with the following history as recorded in EpicCare:  Patient Active Problem List   Diagnosis Date Noted   S/P TAH (total abdominal hysterectomy) 05/03/2019   Fibroids 04/28/2019   Rheumatoid arthritis involving multiple sites with positive rheumatoid factor (HCC) 07/29/2018   High risk medication use 07/29/2018   Primary osteoarthritis of both knees 07/29/2018   Positive QuantiFERON-TB Gold test 07/29/2018   Vitamin D deficiency 07/29/2018   History of gastroesophageal reflux (GERD) 07/29/2018    Current Outpatient Medications  Medication Sig Dispense Refill   Ascorbic Acid (VITAMIN C) 1000 MG tablet Take 1,000 mg by mouth daily.     b complex vitamins tablet Take 1 tablet by mouth daily.     Biotin 10000 MCG TABS Take 10,000 mcg by mouth daily.     cetirizine (ZYRTEC) 10 MG tablet Take 10 mg by mouth daily as needed for allergies.     Cholecalciferol (VITAMIN D3) 125 MCG (5000 UT) CAPS Take 5,000 Units by mouth daily.     diclofenac Sodium (VOLTAREN) 1 % GEL Apply 4 grams on to the skin 4 (four) times daily as needed. 100 g 1   diphenhydrAMINE (BENADRYL) 25 MG tablet Take 50 mg by mouth as needed for itching.     ibuprofen (ADVIL) 200 MG tablet Take 800 mg by mouth as needed for headache or moderate pain.     Multiple Vitamin (MULTI VITAMIN DAILY PO) Take 1 tablet by mouth daily.      omeprazole (PRILOSEC) 20 MG capsule Take 1 capsule (20 mg total) by mouth daily. 90 capsule 1   Turmeric (QC TUMERIC COMPLEX PO) Take by mouth in the morning and at bedtime.     vitamin E 400 UNIT capsule Take 400 Units by mouth daily.     No current facility-administered medications for this visit.    Allergies: Prednisone and Vicodin [hydrocodone-acetaminophen]  Past Medical History:  Diagnosis Date   Anemia    Arthritis    Dyspnea    GERD (gastroesophageal reflux disease)    PONV (postoperative nausea and vomiting)     Past Surgical History:  Procedure  Laterality Date   ABDOMINAL HYSTERECTOMY Left 05/03/2019   Procedure: HYSTERECTOMY ABDOMINAL with left salpingectomy;  Surgeon: Dian Queen, MD;  Location: [facility];  Service: Gynecology;  Laterality: Left;   BTL     CHOLECYSTECTOMY  2007   TUBAL LIGATION  2008    Family History  Problem Relation Age of Onset   Prostate cancer Brother    Prostate cancer Brother    Healthy Daughter    Healthy Daughter    Healthy Son    Healthy Son    Sleep apnea Neg Hx     Social History   Tobacco Use   Smoking status: Never   Smokeless tobacco: Never  Substance Use Topics   Alcohol use: Yes    Comment: occ    Subjective:  Complaining of sore throat "on and off" for years; Notes that has discussed with her GI- had EGD which was normal; describing as "sensation in back of throat." Does not think she has ever seen ENT;  Notes that in the past month she feels like there is a "mass" on left side of her neck; no pain or difficulty swallowing; no sensation of food getting stuck; concerned that the throat sensation is related to enlargement;  Notes that she has had a cough since she had COVID in March 2023- in May  2023, I had recommended CXR which patient declined and she asked that TB test be done instead;  States that cough is productive- most noticeable in the morning; does take Omeprazole OTC for GERD ( notes that prescriptive Omeprazole causes her to feel sick);   Objective:  Vitals:   10/15/22 1557  BP: 118/62  Pulse: 70  Temp: 98.3 F (36.8 C)  TempSrc: Oral  SpO2: 98%  Weight: 204 lb 12.8 oz (92.9 kg)  Height: 5' 5" (1.651 m)    General: Well developed, well nourished, in no acute distress  Skin : Warm and dry.  Head: Normocephalic and atraumatic  Eyes: Sclera and conjunctiva clear; pupils round and reactive to light; extraocular movements intact  Ears: External normal; canals clear; tympanic membranes normal  Oropharynx: Pink, supple. No suspicious lesions  Neck: Supple without  thyromegaly, adenopathy  Lungs: Respirations unlabored; clear to auscultation bilaterally without wheeze, rales, rhonchi  CVS exam: normal rate and regular rhythm.  Abdomen: Soft; nontender; nondistended; normoactive bowel sounds; no masses or hepatosplenomegaly  Musculoskeletal: No deformities; no active joint inflammation  Extremities: No edema, cyanosis, clubbing  Vessels: Symmetric bilaterally  Neurologic: Alert and oriented; speech intact; face symmetrical; moves all extremities well; CNII-XII intact without focal deficit   Assessment:  1. Neck mass   2. Chronic cough     Plan:  Will update cervical ultrasound and CXR; check CBC, CMP today; am suspicious that uncontrolled GERD may be cause of cough but patient prefers not to try increasing her Omeprazole at this time;  Follow up to be determined based on results of imaging and labs; will most likely need to refer to ENT for further evaluation.     No follow-ups on file.  Orders Placed This Encounter  Procedures   US Soft Tissue Head/Neck (NON-THYROID)    Standing Status:   Future    Standing Expiration Date:   10/16/2023    Order Specific Question:   Reason for Exam (SYMPTOM  OR DIAGNOSIS REQUIRED)    Answer:   lump on left side of neck    Comments:   ? lymph node    Order Specific Question:   Preferred imaging location?    Answer:   [facility]   DG Chest 2 View    Standing Status:   Future    Standing Expiration Date:   10/16/2023    Order Specific Question:   Reason for Exam (SYMPTOM  OR DIAGNOSIS REQUIRED)    Answer:   cough x 6 months    Order Specific Question:   Is patient pregnant?    Answer:   No    Order Specific Question:   Preferred imaging location?    Answer:   [facility]   CBC with Differential/Platelet   Comp Met (CMET)    Requested Prescriptions    No prescriptions requested or ordered in this encounter

## 2022-10-16 LAB — CBC WITH DIFFERENTIAL/PLATELET
Basophils Absolute: 0.1 10*3/uL (ref 0.0–0.1)
Basophils Relative: 1 % (ref 0.0–3.0)
Eosinophils Absolute: 0.3 10*3/uL (ref 0.0–0.7)
Eosinophils Relative: 4.8 % (ref 0.0–5.0)
HCT: 40.8 % (ref 36.0–46.0)
Hemoglobin: 13.6 g/dL (ref 12.0–15.0)
Lymphocytes Relative: 52 % — ABNORMAL HIGH (ref 12.0–46.0)
Lymphs Abs: 3 10*3/uL (ref 0.7–4.0)
MCHC: 33.4 g/dL (ref 30.0–36.0)
MCV: 92.7 fl (ref 78.0–100.0)
Monocytes Absolute: 0.6 10*3/uL (ref 0.1–1.0)
Monocytes Relative: 10.1 % (ref 3.0–12.0)
Neutro Abs: 1.8 10*3/uL (ref 1.4–7.7)
Neutrophils Relative %: 32.1 % — ABNORMAL LOW (ref 43.0–77.0)
Platelets: 244 10*3/uL (ref 150.0–400.0)
RBC: 4.39 Mil/uL (ref 3.87–5.11)
RDW: 12.8 % (ref 11.5–15.5)
WBC: 5.7 10*3/uL (ref 4.0–10.5)

## 2022-10-16 LAB — COMPREHENSIVE METABOLIC PANEL
ALT: 38 U/L — ABNORMAL HIGH (ref 0–35)
AST: 30 U/L (ref 0–37)
Albumin: 4.1 g/dL (ref 3.5–5.2)
Alkaline Phosphatase: 59 U/L (ref 39–117)
BUN: 13 mg/dL (ref 6–23)
CO2: 27 mEq/L (ref 19–32)
Calcium: 9.2 mg/dL (ref 8.4–10.5)
Chloride: 105 mEq/L (ref 96–112)
Creatinine, Ser: 0.69 mg/dL (ref 0.40–1.20)
GFR: 98.4 mL/min (ref >60.00)
Glucose, Bld: 83 mg/dL (ref 70–99)
Potassium: 3.8 mEq/L (ref 3.5–5.1)
Sodium: 138 mEq/L (ref 135–145)
Total Bilirubin: 0.3 mg/dL (ref 0.2–1.2)
Total Protein: 7.3 g/dL (ref 6.0–8.3)

## 2022-10-18 ENCOUNTER — Other Ambulatory Visit: Payer: Self-pay | Admitting: Family

## 2022-10-18 ENCOUNTER — Other Ambulatory Visit (HOSPITAL_BASED_OUTPATIENT_CLINIC_OR_DEPARTMENT_OTHER): Payer: Self-pay

## 2022-10-18 ENCOUNTER — Ambulatory Visit (HOSPITAL_BASED_OUTPATIENT_CLINIC_OR_DEPARTMENT_OTHER)
Admission: RE | Admit: 2022-10-18 | Discharge: 2022-10-18 | Disposition: A | Discharge status: Home / Self Care | Payer: No Typology Code available for payment source | Source: Ambulatory Visit | Attending: Family | Admitting: Family

## 2022-10-18 DIAGNOSIS — R221 Localized swelling, mass and lump, neck: Secondary | ICD-10-CM

## 2022-10-18 MED ORDER — DICLOFENAC SODIUM 1 % EX GEL
4.0000 g | Freq: Four times a day (QID) | CUTANEOUS | 1 refills | Status: DC | PRN
  Filled 2022-10-18: qty 100, 7d supply, fill #0
  Filled 2022-10-18 – 2022-10-23 (×2): qty 100, 7d supply, fill #1

## 2022-10-21 ENCOUNTER — Other Ambulatory Visit (HOSPITAL_BASED_OUTPATIENT_CLINIC_OR_DEPARTMENT_OTHER): Payer: Self-pay

## 2022-10-23 ENCOUNTER — Other Ambulatory Visit (HOSPITAL_BASED_OUTPATIENT_CLINIC_OR_DEPARTMENT_OTHER): Payer: Self-pay

## 2022-11-18 ENCOUNTER — Other Ambulatory Visit (HOSPITAL_BASED_OUTPATIENT_CLINIC_OR_DEPARTMENT_OTHER): Payer: Self-pay

## 2022-11-18 ENCOUNTER — Other Ambulatory Visit: Payer: Self-pay | Admitting: Family

## 2022-11-18 MED ORDER — DICLOFENAC SODIUM 1 % EX GEL
4.0000 g | Freq: Four times a day (QID) | CUTANEOUS | 1 refills | Status: DC | PRN
  Filled 2022-11-18: qty 100, 7d supply, fill #0
  Filled 2022-12-12 – 2022-12-19 (×2): qty 100, 7d supply, fill #1

## 2022-12-13 ENCOUNTER — Other Ambulatory Visit: Payer: Self-pay

## 2022-12-19 ENCOUNTER — Other Ambulatory Visit (HOSPITAL_BASED_OUTPATIENT_CLINIC_OR_DEPARTMENT_OTHER): Payer: Self-pay

## 2022-12-31 DIAGNOSIS — Z6833 Body mass index (BMI) 33.0-33.9, adult: Secondary | ICD-10-CM | POA: Diagnosis not present

## 2022-12-31 DIAGNOSIS — N904 Leukoplakia of vulva: Secondary | ICD-10-CM | POA: Diagnosis not present

## 2022-12-31 DIAGNOSIS — N9089 Other specified noninflammatory disorders of vulva and perineum: Secondary | ICD-10-CM | POA: Diagnosis not present

## 2022-12-31 DIAGNOSIS — Z1272 Encounter for screening for malignant neoplasm of vagina: Secondary | ICD-10-CM | POA: Diagnosis not present

## 2022-12-31 DIAGNOSIS — Z01419 Encounter for gynecological examination (general) (routine) without abnormal findings: Secondary | ICD-10-CM | POA: Diagnosis not present

## 2022-12-31 DIAGNOSIS — Z1231 Encounter for screening mammogram for malignant neoplasm of breast: Secondary | ICD-10-CM | POA: Diagnosis not present

## 2022-12-31 DIAGNOSIS — Z113 Encounter for screening for infections with a predominantly sexual mode of transmission: Secondary | ICD-10-CM | POA: Diagnosis not present

## 2023-01-07 ENCOUNTER — Other Ambulatory Visit (HOSPITAL_BASED_OUTPATIENT_CLINIC_OR_DEPARTMENT_OTHER): Payer: Self-pay

## 2023-01-07 ENCOUNTER — Other Ambulatory Visit: Payer: Self-pay | Admitting: Family

## 2023-01-07 MED ORDER — DICLOFENAC SODIUM 1 % EX GEL
4.0000 g | Freq: Four times a day (QID) | CUTANEOUS | 1 refills | Status: DC | PRN
  Filled 2023-01-07: qty 100, 7d supply, fill #0
  Filled 2023-02-06: qty 100, 7d supply, fill #1

## 2023-01-23 ENCOUNTER — Other Ambulatory Visit (HOSPITAL_BASED_OUTPATIENT_CLINIC_OR_DEPARTMENT_OTHER): Payer: Self-pay

## 2023-01-23 DIAGNOSIS — R051 Acute cough: Secondary | ICD-10-CM | POA: Diagnosis not present

## 2023-01-23 DIAGNOSIS — R062 Wheezing: Secondary | ICD-10-CM | POA: Diagnosis not present

## 2023-01-23 DIAGNOSIS — R490 Dysphonia: Secondary | ICD-10-CM | POA: Diagnosis not present

## 2023-01-23 MED ORDER — ALBUTEROL SULFATE HFA 108 (90 BASE) MCG/ACT IN AERS
2.0000 | INHALATION_SPRAY | Freq: Four times a day (QID) | RESPIRATORY_TRACT | 0 refills | Status: DC | PRN
  Filled 2023-01-23: qty 6.7, 20d supply, fill #0

## 2023-01-23 MED ORDER — METHYLPREDNISOLONE 4 MG PO TBPK
ORAL_TABLET | ORAL | 0 refills | Status: DC
  Filled 2023-01-23: qty 21, 6d supply, fill #0

## 2023-02-06 ENCOUNTER — Other Ambulatory Visit (HOSPITAL_BASED_OUTPATIENT_CLINIC_OR_DEPARTMENT_OTHER): Payer: Self-pay

## 2023-03-13 ENCOUNTER — Emergency Department (HOSPITAL_BASED_OUTPATIENT_CLINIC_OR_DEPARTMENT_OTHER): Payer: Commercial Managed Care - PPO

## 2023-03-13 ENCOUNTER — Encounter (HOSPITAL_BASED_OUTPATIENT_CLINIC_OR_DEPARTMENT_OTHER): Payer: Self-pay | Admitting: Pediatrics

## 2023-03-13 ENCOUNTER — Other Ambulatory Visit: Payer: Self-pay

## 2023-03-13 ENCOUNTER — Emergency Department (HOSPITAL_BASED_OUTPATIENT_CLINIC_OR_DEPARTMENT_OTHER)
Admission: EM | Admit: 2023-03-13 | Discharge: 2023-03-13 | Disposition: A | Discharge status: Home / Self Care | Payer: Commercial Managed Care - PPO | Attending: Emergency Medicine | Admitting: Emergency Medicine

## 2023-03-13 DIAGNOSIS — R42 Dizziness and giddiness: Secondary | ICD-10-CM | POA: Insufficient documentation

## 2023-03-13 DIAGNOSIS — R0602 Shortness of breath: Secondary | ICD-10-CM | POA: Diagnosis not present

## 2023-03-13 DIAGNOSIS — R110 Nausea: Secondary | ICD-10-CM | POA: Insufficient documentation

## 2023-03-13 LAB — CBC
HCT: 40.5 % (ref 36.0–46.0)
Hemoglobin: 13.8 g/dL (ref 12.0–15.0)
MCH: 30.6 pg (ref 26.0–34.0)
MCHC: 34.1 g/dL (ref 30.0–36.0)
MCV: 89.8 fL (ref 80.0–100.0)
Platelets: 249 10*3/uL (ref 150–400)
RBC: 4.51 MIL/uL (ref 3.87–5.11)
RDW: 12 % (ref 11.5–15.5)
WBC: 5.5 10*3/uL (ref 4.0–10.5)
nRBC: 0 % (ref 0.0–0.2)

## 2023-03-13 LAB — BASIC METABOLIC PANEL
Anion gap: 7 (ref 5–15)
BUN: 9 mg/dL (ref 6–20)
CO2: 24 mmol/L (ref 22–32)
Calcium: 9.2 mg/dL (ref 8.9–10.3)
Chloride: 104 mmol/L (ref 98–111)
Creatinine, Ser: 0.67 mg/dL (ref 0.44–1.00)
GFR, Estimated: >60 mL/min (ref >60)
Glucose, Bld: 107 mg/dL — ABNORMAL HIGH (ref 70–99)
Potassium: 3.7 mmol/L (ref 3.5–5.1)
Sodium: 135 mmol/L (ref 135–145)

## 2023-03-13 LAB — URINALYSIS, ROUTINE W REFLEX MICROSCOPIC
Bilirubin Urine: NEGATIVE
Glucose, UA: NEGATIVE mg/dL
Ketones, ur: NEGATIVE mg/dL
Leukocytes,Ua: NEGATIVE
Nitrite: NEGATIVE
Protein, ur: NEGATIVE mg/dL
Specific Gravity, Urine: 1.01 (ref 1.005–1.030)
pH: 5.5 (ref 5.0–8.0)

## 2023-03-13 LAB — URINALYSIS, MICROSCOPIC (REFLEX)

## 2023-03-13 LAB — TROPONIN I (HIGH SENSITIVITY): Troponin I (High Sensitivity): 4 ng/L (ref <18)

## 2023-03-13 LAB — CBG MONITORING, ED: Glucose-Capillary: 87 mg/dL (ref 70–99)

## 2023-03-13 MED ORDER — ONDANSETRON 4 MG PO TBDP
4.0000 mg | ORAL_TABLET | Freq: Four times a day (QID) | ORAL | 0 refills | Status: DC | PRN
  Filled 2023-03-13: qty 15, 4d supply, fill #0

## 2023-03-13 MED ORDER — SODIUM CHLORIDE 0.9 % IV BOLUS
1000.0000 mL | Freq: Once | INTRAVENOUS | Status: AC
  Administered 2023-03-13: 1000 mL via INTRAVENOUS

## 2023-03-13 MED ORDER — ONDANSETRON HCL 4 MG/2ML IJ SOLN
4.0000 mg | Freq: Once | INTRAMUSCULAR | Status: AC
  Administered 2023-03-13: 4 mg via INTRAVENOUS
  Filled 2023-03-13: qty 2

## 2023-03-13 NOTE — ED Provider Notes (Signed)
[facility] Provider Note   CSN: [id number] Arrival date & time: 03/13/23  1436     History  Chief Complaint  Patient presents with   Dizziness    Cheryl Bell is a 57 y.o. female.  Reports 5 days of intermittent feeling of dizziness described as lightheadedness on and off for the past 5 days.  States that the episodes last for about 5 minutes at a time.  She is also noticed occasional shortness of breath on exerting herself.  She states these do not seem to go together.  She also reports intermittent feeling of palpitations when she feels dizzy.  No symptoms currently in the ED.  No chest pain, no cough, no fevers.  No lower extremity swelling or pain, no history of VTE.  No history of hypertension.  No vomiting but does report nausea this been relatively constant.   Dizziness      Home Medications Prior to Admission medications   Medication Sig Start Date End Date Taking? Authorizing Provider  ondansetron (ZOFRAN-ODT) 4 MG disintegrating tablet Take 1 tablet (4 mg total) by mouth every 6 (six) hours as needed for nausea or vomiting. 03/13/23  Yes Gilbert Manolis A, PA-C  albuterol (VENTOLIN HFA) 108 (90 Base) MCG/ACT inhaler Inhale 2 puffs into the lungs every 6 (six) hours as needed for wheezing. 01/23/23     Ascorbic Acid (VITAMIN C) 1000 MG tablet Take 1,000 mg by mouth daily.    [provider]  b complex vitamins tablet Take 1 tablet by mouth daily.    [provider]  Biotin 10000 MCG TABS Take 10,000 mcg by mouth daily.    [provider]  cetirizine (ZYRTEC) 10 MG tablet Take 10 mg by mouth daily as needed for allergies.    [provider]  Cholecalciferol (VITAMIN D3) 125 MCG (5000 UT) CAPS Take 5,000 Units by mouth daily.    [provider]  diclofenac Sodium (VOLTAREN) 1 % GEL Apply 4 g topically 4 (four) times daily as needed. 01/07/23   Marrian Salvage, FNP   diphenhydrAMINE (BENADRYL) 25 MG tablet Take 50 mg by mouth as needed for itching.    [provider]  ibuprofen (ADVIL) 200 MG tablet Take 800 mg by mouth as needed for headache or moderate pain.    [provider]  methylPREDNISolone (MEDROL DOSEPAK) 4 MG TBPK tablet Take as directed on pack for 6 days. 01/23/23     Multiple Vitamin (MULTI VITAMIN DAILY PO) Take 1 tablet by mouth daily.     [provider]  omeprazole (PRILOSEC) 20 MG capsule Take 1 capsule (20 mg total) by mouth daily. 12/07/21   Marrian Salvage, FNP  Turmeric (QC TUMERIC COMPLEX PO) Take by mouth in the morning and at bedtime.    [provider]  vitamin E 400 UNIT capsule Take 400 Units by mouth daily.    [provider]      Allergies    Prednisone and Vicodin [hydrocodone-acetaminophen]    Review of Systems   Review of Systems  Neurological:  Positive for dizziness.    Physical Exam Updated Vital Signs BP 115/65   Pulse 65   Temp 98.4 F (36.9 C) (Oral)   Resp 20   Ht 5\' 5"  (1.651 m)   Wt 86.2 kg   SpO2 99%   BMI 31.62 kg/m  Physical Exam Vitals and nursing note reviewed.  Constitutional:  General: She is not in acute distress.    Appearance: She is well-developed.  HENT:     Head: Normocephalic and atraumatic.  Eyes:     Conjunctiva/sclera: Conjunctivae normal.  Cardiovascular:     Rate and Rhythm: Normal rate and regular rhythm.     Heart sounds: No murmur heard. Pulmonary:     Effort: Pulmonary effort is normal. No respiratory distress.     Breath sounds: Normal breath sounds.  Abdominal:     Palpations: Abdomen is soft.     Tenderness: There is no abdominal tenderness.  Musculoskeletal:        General: No swelling.     Cervical back: Neck supple.  Skin:    General: Skin is warm and dry.     Capillary Refill: Capillary refill takes less than 2 seconds.  Neurological:     General: No focal deficit present.     Mental Status: She is  alert and oriented to person, place, and time.     Cranial Nerves: No cranial nerve deficit.     Motor: No weakness.     Coordination: Coordination normal.     Gait: Gait normal.  Psychiatric:        Mood and Affect: Mood normal.     ED Results / Procedures / Treatments   Labs (all labs ordered are listed, but only abnormal results are displayed) Labs Reviewed  BASIC METABOLIC PANEL - Abnormal; Notable for the following components:      Result Value   Glucose, Bld 107 (*)    All other components within normal limits  URINALYSIS, ROUTINE W REFLEX MICROSCOPIC - Abnormal; Notable for the following components:   Hgb urine dipstick TRACE (*)    All other components within normal limits  URINALYSIS, MICROSCOPIC (REFLEX) - Abnormal; Notable for the following components:   Bacteria, UA RARE (*)    All other components within normal limits  CBC  CBG MONITORING, ED  TROPONIN I (HIGH SENSITIVITY)    EKG EKG Interpretation  Date/Time:  Thursday March 13 2023 14:47:21 EDT Ventricular Rate:  82 PR Interval:  131 QRS Duration: 76 QT Interval:  347 QTC Calculation: 406 R Axis:   32 Text Interpretation: Sinus rhythm Low voltage, precordial leads Baseline wander in lead(s) V3 when cmpared to prior, similar appearance with slower rate and less artifact. No STEMI Confirmed by Antony Blackbird [phone]) on 03/13/2023 5:46:42 PM  Radiology DG Chest Portable 1 View  Result Date: 03/13/2023 CLINICAL DATA:  Shortness of breath EXAM: PORTABLE CHEST 1 VIEW COMPARISON:  10/15/2022 FINDINGS: The heart size and mediastinal contours are within normal limits. Both lungs are clear. The visualized skeletal structures are unremarkable. IMPRESSION: No active disease. Electronically Signed   By: Abigail Miyamoto M.D.   On: 03/13/2023 16:57    Procedures Procedures    Medications Ordered in ED Medications  ondansetron (ZOFRAN) injection 4 mg (4 mg Intravenous Given 03/13/23 1748)  sodium chloride 0.9 % bolus  1,000 mL (1,000 mLs Intravenous New Bag/Given 03/13/23 1748)    ED Course/ Medical Decision Making/ A&P                             Medical Decision Making This patient presents to the ED for concern of lightheadedness on and off for 5 days, this involves an extensive number of treatment options, and is a complaint that carries with it a high risk of complications and morbidity.  The  differential diagnosis includes dehydration, orthostatic hypotension, arrhythmia, other   Co morbidities that complicate the patient evaluation  Rheumatoid arthritis   Additional history obtained:  Additional history obtained from EMR External records from outside source obtained and reviewed including previous outpatient neck ultrasound from July 2023, outpatient sleep study results, prior PCP r notes   Lab Tests:  I Ordered, and personally interpreted labs.  The pertinent results include: CBC is normal, BMP normal as well, troponin is normal at 4, urinalysis only shows trace hemoglobin   Imaging Studies ordered:  I ordered imaging studies including x-ray chest I independently visualized and interpreted imaging which showed no pulmonary edema or infiltrate, no cardiomegaly I agree with the radiologist interpretation     Problem List / ED Course / Critical interventions / Medication management  Patient presents for intermittent lightheadedness, no vertigo, no focal neurologic deficits, he is having some dyspnea on exertion so to get EKG and troponin as well as chest x-ray.  These are all very reassuring, symptoms have been going on for 5 days.  She has no chest pain, no VTE symptoms other risk factors, do not feel PE workup is indicated at this time.  Feels is extremely.  Patient's EKG is normal.  The neck is dizzy on orthostatics, she has been having nausea and was given fluids and Zofran.  Will recheck prior to discharge and plan on having patient follow-up with PCP, discussed she can follow-up  with cardiology due to the palpitations as they could do ambulatory cardiac monitoring for further evaluation.  She did bring up as well that she has had almost a year of some swelling to the left neck.  I do palpate a lymph node in the area and it is not tender.  She had ultrasound in July 2023 for this that was normal lymph node.  I discussed with her since it is still bothersome to her we can give her ENT follow-up to see if they wanted do any further evaluation since it is bothersome to her.  She also noted a feeling of a drum beating in the left ear on and off.  TMs are normal on exam.  Discussed she could try nasal steroid and again follow-up with ENT. We discussed drinking plenty fluids to remain hydrated could be a cause of dizziness. She is given strict return precautions. I ordered medication including zofran, fluids  for nausea  Reevaluation of the patient after these medicines showed that the patient improved I have reviewed the patients home medicines and have made adjustments as needed      Amount and/or Complexity of Data Reviewed Labs: ordered. Radiology: ordered.  Risk Prescription drug management.           Final Clinical Impression(s) / ED Diagnoses Final diagnoses:  Dizziness  Nausea    Rx / DC Orders ED Discharge Orders          Ordered    ondansetron (ZOFRAN-ODT) 4 MG disintegrating tablet  Every 6 hours PRN        03/13/23 1804              Darci Current 03/13/23 1843    Tegeler, Gwenyth Allegra, MD 03/13/23 [id number]

## 2023-03-13 NOTE — ED Triage Notes (Signed)
C/O dizziness started Saturday along with nauseous. C/O headache that comes and go in nature. Denies PMH other than reflux and arthritis.

## 2023-03-13 NOTE — Discharge Instructions (Signed)
Evaluated today for dizziness, your blood work, chest x-ray and EKG were all very reassuring.  Make sure you are drinking plenty of fluids to stay hydrated.  As discussed you can make an appointment to follow-up with cardiology regarding your palpitations as they may want to do ambulatory cardiac monitoring.  You are also given the number for ENT to follow-up regarding the bump on your neck.   Is come back to the ER if you have any new or worsening symptoms especially passing out, chest pain, difficulty breathing, fevers or chills or other worrisome changes

## 2023-03-13 NOTE — ED Notes (Signed)
Orthostatic vitals completed per order. Pt reports no dizziness.  

## 2023-03-14 ENCOUNTER — Other Ambulatory Visit (HOSPITAL_BASED_OUTPATIENT_CLINIC_OR_DEPARTMENT_OTHER): Payer: Self-pay

## 2023-03-14 ENCOUNTER — Other Ambulatory Visit: Payer: Self-pay

## 2023-03-17 ENCOUNTER — Other Ambulatory Visit (HOSPITAL_BASED_OUTPATIENT_CLINIC_OR_DEPARTMENT_OTHER): Payer: Self-pay

## 2023-03-17 ENCOUNTER — Telehealth: Payer: Self-pay | Admitting: Family

## 2023-03-19 ENCOUNTER — Other Ambulatory Visit (HOSPITAL_BASED_OUTPATIENT_CLINIC_OR_DEPARTMENT_OTHER): Payer: Self-pay

## 2023-03-19 ENCOUNTER — Other Ambulatory Visit: Payer: Self-pay

## 2023-03-19 MED ORDER — DICLOFENAC SODIUM 1 % EX GEL
4.0000 g | Freq: Four times a day (QID) | CUTANEOUS | 1 refills | Status: DC | PRN
  Filled 2023-03-19: qty 100, 7d supply, fill #0
  Filled 2023-07-14: qty 100, 7d supply, fill #1

## 2023-03-19 NOTE — Telephone Encounter (Signed)
Refill has been sent.  °

## 2023-03-19 NOTE — Telephone Encounter (Signed)
Pt is calling to check on the status of rx.    diclofenac Sodium (VOLTAREN) 1 % GEL  [facility] [address], [place], [place] [address] Phone: [phone]  Fax: [phone]

## 2023-07-14 ENCOUNTER — Other Ambulatory Visit (HOSPITAL_BASED_OUTPATIENT_CLINIC_OR_DEPARTMENT_OTHER): Payer: Self-pay

## 2023-08-15 DIAGNOSIS — K219 Gastro-esophageal reflux disease without esophagitis: Secondary | ICD-10-CM | POA: Diagnosis not present

## 2023-08-20 ENCOUNTER — Other Ambulatory Visit: Payer: Self-pay | Admitting: Family

## 2023-08-20 ENCOUNTER — Other Ambulatory Visit (HOSPITAL_BASED_OUTPATIENT_CLINIC_OR_DEPARTMENT_OTHER): Payer: Self-pay

## 2023-08-21 ENCOUNTER — Other Ambulatory Visit (HOSPITAL_BASED_OUTPATIENT_CLINIC_OR_DEPARTMENT_OTHER): Payer: Self-pay

## 2023-08-21 MED ORDER — DICLOFENAC SODIUM 1 % EX GEL
4.0000 g | Freq: Four times a day (QID) | CUTANEOUS | 0 refills | Status: DC | PRN
  Filled 2023-08-21: qty 100, 7d supply, fill #0

## 2023-09-19 ENCOUNTER — Encounter: Payer: Self-pay | Admitting: Family

## 2023-09-19 ENCOUNTER — Other Ambulatory Visit (HOSPITAL_BASED_OUTPATIENT_CLINIC_OR_DEPARTMENT_OTHER): Payer: Self-pay

## 2023-09-19 ENCOUNTER — Ambulatory Visit (INDEPENDENT_AMBULATORY_CARE_PROVIDER_SITE_OTHER): Payer: Commercial Managed Care - PPO | Admitting: Family

## 2023-09-19 VITALS — BP 118/80 | HR 85 | Resp 18 | Ht 65.0 in | Wt 199.0 lb

## 2023-09-19 DIAGNOSIS — Z0000 Encounter for general adult medical examination without abnormal findings: Secondary | ICD-10-CM | POA: Diagnosis not present

## 2023-09-19 DIAGNOSIS — Z13220 Encounter for screening for lipoid disorders: Secondary | ICD-10-CM | POA: Diagnosis not present

## 2023-09-19 MED ORDER — DICLOFENAC SODIUM 1 % EX GEL
4.0000 g | Freq: Four times a day (QID) | CUTANEOUS | 0 refills | Status: DC | PRN
  Filled 2023-09-19: qty 100, 7d supply, fill #0

## 2023-09-19 NOTE — Progress Notes (Signed)
Cheryl Bell is a 57 y.o. female with the following history as recorded in EpicCare:  Patient Active Problem List   Diagnosis Date Noted   S/P TAH (total abdominal hysterectomy) 05/03/2019   Fibroids 04/28/2019   Rheumatoid arthritis involving multiple sites with positive rheumatoid factor (HCC) 07/29/2018   High risk medication use 07/29/2018   Primary osteoarthritis of both knees 07/29/2018   Positive QuantiFERON-TB Gold test 07/29/2018   Vitamin D deficiency 07/29/2018   History of gastroesophageal reflux (GERD) 07/29/2018    Current Outpatient Medications  Medication Sig Dispense Refill   Ascorbic Acid (VITAMIN C) 1000 MG tablet Take 1,000 mg by mouth daily.     b complex vitamins tablet Take 1 tablet by mouth daily.     Biotin 16109 MCG TABS Take 10,000 mcg by mouth daily.     cetirizine (ZYRTEC) 10 MG tablet Take 10 mg by mouth daily as needed for allergies.     Cholecalciferol (VITAMIN D3) 125 MCG (5000 UT) CAPS Take 5,000 Units by mouth daily.     diphenhydrAMINE (BENADRYL) 25 MG tablet Take 50 mg by mouth as needed for itching.     ibuprofen (ADVIL) 200 MG tablet Take 800 mg by mouth as needed for headache or moderate pain.     Multiple Vitamin (MULTI VITAMIN DAILY PO) Take 1 tablet by mouth daily.      omeprazole (PRILOSEC) 20 MG capsule Take 1 capsule (20 mg total) by mouth daily. 90 capsule 1   ondansetron (ZOFRAN-ODT) 4 MG disintegrating tablet Take 1 tablet (4 mg total) by mouth every 6 (six) hours as needed for nausea or vomiting. 15 tablet 0   Turmeric (QC TUMERIC COMPLEX PO) Take by mouth in the morning and at bedtime.     vitamin E 400 UNIT capsule Take 400 Units by mouth daily.     diclofenac Sodium (VOLTAREN) 1 % GEL Apply 4 g topically 4 (four) times daily as needed. 100 g 0   No current facility-administered medications for this visit.    Allergies: Prednisone and Vicodin [hydrocodone-acetaminophen]  Past Medical History:  Diagnosis Date   Anemia     Arthritis    Dyspnea    GERD (gastroesophageal reflux disease)    PONV (postoperative nausea and vomiting)     Past Surgical History:  Procedure Laterality Date   ABDOMINAL HYSTERECTOMY Left 05/03/2019   Procedure: HYSTERECTOMY ABDOMINAL with left salpingectomy;  Surgeon: Marcelle Overlie, [place];  Location: [facility] OR;  Service: Gynecology;  Laterality: Left;   BTL     CHOLECYSTECTOMY  2007   TUBAL LIGATION  2008    Family History  Problem Relation Age of Onset   Prostate cancer Brother    Prostate cancer Brother    Healthy Daughter    Healthy Daughter    Healthy Son    Healthy Son    Sleep apnea Neg Hx     Social History   Tobacco Use   Smoking status: Never   Smokeless tobacco: Never  Substance Use Topics   Alcohol use: Yes    Comment: occ    Subjective:   Presents today for a yearly CPE; no acute concerns today; needs exam updated for employer; Will be getting flu shot through employer tomorrow; has not needed to follow up with rheumatology- trying to follow with low inflammation diet/ exercising at least 2 x per week;   In school for psychiatric nurse practitioner; 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  Head: Normocephalic and atraumatic  Eyes: Sclera and conjunctiva clear; pupils round and reactive to light; extraocular movements intact  Ears: External normal; canals clear; tympanic membranes normal  Oropharynx: Pink, supple. No suspicious lesions  Neck: Supple without thyromegaly, adenopathy  Lungs: Respirations unlabored; clear to auscultation bilaterally without wheeze, rales, rhonchi  CVS exam: normal rate and regular rhythm.  Abdomen: Soft; nontender; nondistended; normoactive bowel sounds; no masses or hepatosplenomegaly   Musculoskeletal: No deformities; no active joint inflammation  Extremities: No edema, cyanosis, clubbing  Vessels: Symmetric bilaterally  Neurologic: Alert and oriented; speech intact; face symmetrical; moves all extremities well; CNII-XII intact without focal deficit   Assessment:  1. PE (physical exam), annual   2. Lipid screening     Plan:  Age appropriate preventive healthcare needs addressed; encouraged regular eye doctor and dental exams; encouraged regular exercise; will update labs and refills as needed today; follow-up in 1 year, sooner prn.    No follow-ups on file.  Orders Placed This Encounter  Procedures   CBC with Differential/Platelet   Comp Met (CMET)   Lipid panel   Hemoglobin A1c    Requested Prescriptions   Signed Prescriptions Disp Refills   diclofenac Sodium (VOLTAREN) 1 % GEL 100 g 0    Sig: Apply 4 g topically 4 (four) times daily as needed.

## 2023-09-20 LAB — CBC WITH DIFFERENTIAL/PLATELET
Absolute Monocytes: 394 {cells}/uL (ref 200–950)
Basophils Absolute: 19 {cells}/uL (ref 0–200)
Basophils Relative: 0.4 %
Eosinophils Absolute: 221 {cells}/uL (ref 15–500)
Eosinophils Relative: 4.6 %
HCT: 39.4 % (ref 35.0–45.0)
Hemoglobin: 13 g/dL (ref 11.7–15.5)
Lymphs Abs: 2698 {cells}/uL (ref 850–3900)
MCH: 30.2 pg (ref 27.0–33.0)
MCHC: 33 g/dL (ref 32.0–36.0)
MCV: 91.4 fL (ref 80.0–100.0)
MPV: 10.2 fL (ref 7.5–12.5)
Monocytes Relative: 8.2 %
Neutro Abs: 1469 {cells}/uL — ABNORMAL LOW (ref 1500–7800)
Neutrophils Relative %: 30.6 %
Platelets: 248 10*3/uL (ref 140–400)
RBC: 4.31 10*6/uL (ref 3.80–5.10)
RDW: 12.4 % (ref 11.0–15.0)
Total Lymphocyte: 56.2 %
WBC: 4.8 10*3/uL (ref 3.8–10.8)

## 2023-09-20 LAB — COMPREHENSIVE METABOLIC PANEL
AG Ratio: 1.2 (calc) (ref 1.0–2.5)
ALT: 30 U/L — ABNORMAL HIGH (ref 6–29)
AST: 26 U/L (ref 10–35)
Albumin: 4.1 g/dL (ref 3.6–5.1)
Alkaline phosphatase (APISO): 54 U/L (ref 37–153)
BUN: 10 mg/dL (ref 7–25)
CO2: 24 mmol/L (ref 20–32)
Calcium: 9.4 mg/dL (ref 8.6–10.4)
Chloride: 103 mmol/L (ref 98–110)
Creat: 0.68 mg/dL (ref 0.50–1.03)
Globulin: 3.3 g/dL (ref 1.9–3.7)
Glucose, Bld: 96 mg/dL (ref 65–99)
Potassium: 4 mmol/L (ref 3.5–5.3)
Sodium: 136 mmol/L (ref 135–146)
Total Bilirubin: 0.5 mg/dL (ref 0.2–1.2)
Total Protein: 7.4 g/dL (ref 6.1–8.1)

## 2023-09-20 LAB — HEMOGLOBIN A1C
Hgb A1c MFr Bld: 5.8 %{Hb} — ABNORMAL HIGH (ref <5.7)
Mean Plasma Glucose: 120 mg/dL
eAG (mmol/L): 6.6 mmol/L

## 2023-09-20 LAB — LIPID PANEL
Cholesterol: 177 mg/dL (ref <200)
HDL: 48 mg/dL — ABNORMAL LOW (ref >=50)
LDL Cholesterol (Calc): 109 mg/dL — ABNORMAL HIGH
Non-HDL Cholesterol (Calc): 129 mg/dL (ref <130)
Total CHOL/HDL Ratio: 3.7 (calc) (ref <5.0)
Triglycerides: 103 mg/dL (ref <150)

## 2023-10-15 ENCOUNTER — Telehealth: Payer: Self-pay

## 2023-10-15 NOTE — Telephone Encounter (Signed)
Appt scheduled w/ Vernona Rieger on 10/25 however- triage note says needs to be seen within 24 hours.

## 2023-10-15 NOTE — Telephone Encounter (Signed)
Initial Comment Caller has been having dizziness for a long time. She was in the hospital the beginning of the year for it. Last night she was having abd pain and nausea also. She has really bad anxiety and Claustrophobia. She thinks her anxiety might be causing everything. Translation No Nurse Assessment Nurse: Shawnie Dapper, RN, Arline Asp Date/Time (Eastern Time): 10/15/2023 12:38:50 PM Confirm and document reason for call. If symptomatic, describe symptoms. ---Caller states that she started to have abdominal pain last night and nausea. Caller states that upset stomach is about 3/10. Denies fever and vomiting. Caller states that she does have diarrhea x7. Does the patient have any new or worsening symptoms? ---Yes Will a triage be completed? ---Yes Related visit to physician within the last 2 weeks? ---No Does the PT have any chronic conditions? (i.e. diabetes, asthma, this includes High risk factors for pregnancy, etc.) ---Yes List chronic conditions. ---arthritis, reflux Is this a behavioral health or substance abuse call? ---No Guidelines Guideline Title Affirmed Question Affirmed Notes Nurse Date/Time (Eastern Time) Diarrhea [1] SEVERE diarrhea (e.g., 7 or more times / day more than normal) AND [2] Elana Alm 10/15/2023 12:40:35 PM PLEASE NOTE: All timestamps contained within this report are represented as [place] Standard Time. CONFIDENTIALTY NOTICE: This fax transmission is intended only for the addressee. It contains information that is legally privileged, confidential or otherwise protected from use or disclosure. If you are not the intended recipient, you are strictly prohibited from reviewing, disclosing, copying using or disseminating any of this information or taking any action in reliance on or regarding this information. If you have received this fax in error, please notify us immediately by telephone so that we can arrange for its return to [place]. Phone: [phone],  Toll-Free: [phone], Fax: [phone] Page: 2 of 2 Call Id: [id number] Guidelines Guideline Title Affirmed Question Affirmed Notes Nurse Date/Time Lamount Cohen Time) present > 24 hours (1 day) Disp. Time Lamount Cohen Time) Disposition Final User 10/15/2023 12:37:23 PM Send to Urgent Corliss Blacker 10/15/2023 12:44:00 PM See PCP within 24 Hours Yes Shawnie Dapper RN, Vici Final Disposition 10/15/2023 12:44:00 PM See PCP within 24 Hours Yes Shawnie Dapper, RN, Arline Asp Caller Disagree/Comply Comply Caller Understands Yes PreDisposition Did not know what to do Care Advice Given Per Guideline SEE PCP WITHIN 24 HOURS: * IF OFFICE WILL BE OPEN: You need to be examined within the next 24 hours. Call your doctor (or NP/PA) when the office opens and make an appointment. FLUID THERAPY DURING SEVERE DIARRHEA: * Drink more fluids, at least 8 to 10 cups daily. One cup equals 8 oz (240 ml). * WATER: For mild to moderate diarrhea, water is often the best liquid to drink. You should also eat some salty foods (such as potato chips, pretzels, saltine crackers). This is important to make sure you are getting enough salt, sugars, and fluids to meet your body's needs. * AVOID caffeinated beverages. Reason: Caffeine is mildly dehydrating. * AVOID alcohol beverages (such as beer, wine, hard liquor). * AVOID carbonated soft drinks (soda) as these can make your diarrhea worse. DIARRHEA MEDICINE - LOPERAMIDE (IMODIUM AD): * This medicine helps decrease diarrhea. It is available over-the-counter (OTC) in a drugstore. * Adult dosage: 4 mg (2 capsules) is the recommended first dose. You may take an additional 2 mg (1 capsule) after each loose stool. CALL BACK IF: * Bloody stools * Constant or severe abdomen pain CARE ADVICE given per Diarrhea (Adult) guideline. Referrals REFERRED TO PCP OFFICE

## 2023-10-17 ENCOUNTER — Encounter: Payer: Self-pay | Admitting: Family

## 2023-10-17 ENCOUNTER — Other Ambulatory Visit: Payer: Self-pay | Admitting: Family

## 2023-10-17 ENCOUNTER — Other Ambulatory Visit (HOSPITAL_BASED_OUTPATIENT_CLINIC_OR_DEPARTMENT_OTHER): Payer: Self-pay

## 2023-10-17 ENCOUNTER — Ambulatory Visit: Payer: Commercial Managed Care - PPO | Admitting: Family

## 2023-10-17 VITALS — BP 124/78 | HR 65 | Resp 18 | Ht 65.0 in | Wt 199.0 lb

## 2023-10-17 DIAGNOSIS — F419 Anxiety disorder, unspecified: Secondary | ICD-10-CM | POA: Diagnosis not present

## 2023-10-17 MED ORDER — HYDROXYZINE PAMOATE 25 MG PO CAPS
25.0000 mg | ORAL_CAPSULE | Freq: Three times a day (TID) | ORAL | 0 refills | Status: AC | PRN
  Filled 2023-10-17: qty 30, 10d supply, fill #0

## 2023-10-17 NOTE — Patient Instructions (Signed)
Do not mix the Zyrtec and Atarax;  I have put in referral to behavioral health but you could also EAP through [facility] or counseling through your graduate program;

## 2023-10-17 NOTE — Progress Notes (Signed)
Cheryl Bell is a 57 y.o. female with the following history as recorded in EpicCare:  Patient Active Problem List   Diagnosis Date Noted   S/P TAH (total abdominal hysterectomy) 05/03/2019   Fibroids 04/28/2019   Rheumatoid arthritis involving multiple sites with positive rheumatoid factor (HCC) 07/29/2018   High risk medication use 07/29/2018   Primary osteoarthritis of both knees 07/29/2018   Positive QuantiFERON-TB Gold test 07/29/2018   Vitamin D deficiency 07/29/2018   History of gastroesophageal reflux (GERD) 07/29/2018    Current Outpatient Medications  Medication Sig Dispense Refill   Ascorbic Acid (VITAMIN C) 1000 MG tablet Take 1,000 mg by mouth daily.     b complex vitamins tablet Take 1 tablet by mouth daily.     Biotin 40981 MCG TABS Take 10,000 mcg by mouth daily.     cetirizine (ZYRTEC) 10 MG tablet Take 10 mg by mouth daily as needed for allergies.     Cholecalciferol (VITAMIN D3) 125 MCG (5000 UT) CAPS Take 5,000 Units by mouth daily.     diclofenac Sodium (VOLTAREN) 1 % GEL Apply 4 g topically 4 (four) times daily as needed. 100 g 0   diphenhydrAMINE (BENADRYL) 25 MG tablet Take 50 mg by mouth as needed for itching.     hydrOXYzine (VISTARIL) 25 MG capsule Take 1 capsule (25 mg total) by mouth every 8 (eight) hours as needed for anxiety. 30 capsule 0   ibuprofen (ADVIL) 200 MG tablet Take 800 mg by mouth as needed for headache or moderate pain.     Multiple Vitamin (MULTI VITAMIN DAILY PO) Take 1 tablet by mouth daily.      omeprazole (PRILOSEC) 20 MG capsule Take 1 capsule (20 mg total) by mouth daily. 90 capsule 1   ondansetron (ZOFRAN-ODT) 4 MG disintegrating tablet Take 1 tablet (4 mg total) by mouth every 6 (six) hours as needed for nausea or vomiting. 15 tablet 0   Turmeric (QC TUMERIC COMPLEX PO) Take by mouth in the morning and at bedtime.     vitamin E 400 UNIT capsule Take 400 Units by mouth daily.     No current facility-administered medications for this  visit.    Allergies: Prednisone and Vicodin [hydrocodone-acetaminophen]  Past Medical History:  Diagnosis Date   Anemia    Arthritis    Dyspnea    GERD (gastroesophageal reflux disease)    PONV (postoperative nausea and vomiting)     Past Surgical History:  Procedure Laterality Date   ABDOMINAL HYSTERECTOMY Left 05/03/2019   Procedure: HYSTERECTOMY ABDOMINAL with left salpingectomy;  Surgeon: Marcelle Overlie, [place];  Location: [facility] OR;  Service: Gynecology;  Laterality: Left;   BTL     CHOLECYSTECTOMY  2007   TUBAL LIGATION  2008    Family History  Problem Relation Age of Onset   Prostate cancer Brother    Prostate cancer Brother    Healthy Daughter    Healthy Daughter    Healthy Son    Healthy Son    Sleep apnea Neg Hx     Social History   Tobacco Use   Smoking status: Never   Smokeless tobacco: Never  Substance Use Topics   Alcohol use: Yes    Comment: occ    Subjective:   Patient had called earlier this week with concerns for suspected panic attack- known history of claustrophobia- can't do bridges, heights or enclosed spaces; had to do a school project which required her to wear a VR mask and could not  complete; does need note to document her claustrophobia; now asking to meet with therapist to help her work through symptoms. Also requesting trial of medication to have to use as needed;     Objective:  Vitals:   10/17/23 0958  BP: 124/78  Pulse: 65  Resp: 18  SpO2: 98%  Weight: 199 lb (90.3 kg)  Height: 5\' 5"  (1.651 m)    General: Well developed, well nourished, in no acute distress  Skin : Warm and dry.  Head: Normocephalic and atraumatic  Eyes: Sclera and conjunctiva clear; pupils round and reactive to light; extraocular movements intact  Ears: External normal; canals clear; tympanic membranes normal  Oropharynx: Pink, supple. No suspicious lesions  Neck: Supple without thyromegaly, adenopathy  Lungs: Respirations unlabored; clear to auscultation  bilaterally without wheeze, rales, rhonchi  CVS exam: normal rate and regular rhythm.  Neurologic: Alert and oriented; speech intact; face symmetrical; moves all extremities well; CNII-XII intact without focal deficit   Assessment:  1. Anxiety     Plan:  School note written as requested to document her claustrophobia; referral to behavioral health- patient will also look into EAP; she defers daily prescription at this time- can try Hydroxyzine 25 mg tid prn anxiety; follow up as needed otherwise;  Time spent 30 minutes  No follow-ups on file.  Orders Placed This Encounter  Procedures   Ambulatory referral to Behavioral Health    Referral Priority:   Routine    Referral Type:   Psychiatric    Referral Reason:   Specialty Services Required    Requested Specialty:   Behavioral Health    Number of Visits Requested:   1    Requested Prescriptions   Signed Prescriptions Disp Refills   hydrOXYzine (VISTARIL) 25 MG capsule 30 capsule 0    Sig: Take 1 capsule (25 mg total) by mouth every 8 (eight) hours as needed for anxiety.

## 2023-11-24 ENCOUNTER — Ambulatory Visit (INDEPENDENT_AMBULATORY_CARE_PROVIDER_SITE_OTHER): Payer: Commercial Managed Care - PPO | Admitting: Licensed Clinical Social Worker

## 2023-11-24 DIAGNOSIS — F419 Anxiety disorder, unspecified: Secondary | ICD-10-CM | POA: Diagnosis not present
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
## 2023-12-02 ENCOUNTER — Ambulatory Visit: Payer: Commercial Managed Care - PPO | Admitting: Licensed Clinical Social Worker

## 2024-01-13 ENCOUNTER — Other Ambulatory Visit (HOSPITAL_BASED_OUTPATIENT_CLINIC_OR_DEPARTMENT_OTHER): Payer: Self-pay

## 2024-01-13 ENCOUNTER — Other Ambulatory Visit: Payer: Self-pay | Admitting: Family

## 2024-01-13 MED ORDER — DICLOFENAC SODIUM 1 % EX GEL
4.0000 g | Freq: Four times a day (QID) | CUTANEOUS | 0 refills | Status: DC | PRN
  Filled 2024-01-13: qty 100, 7d supply, fill #0

## 2024-01-15 ENCOUNTER — Other Ambulatory Visit (HOSPITAL_BASED_OUTPATIENT_CLINIC_OR_DEPARTMENT_OTHER): Payer: Self-pay

## 2024-01-15 ENCOUNTER — Other Ambulatory Visit: Payer: Self-pay

## 2024-01-15 DIAGNOSIS — Z113 Encounter for screening for infections with a predominantly sexual mode of transmission: Secondary | ICD-10-CM | POA: Diagnosis not present

## 2024-01-15 MED ORDER — ESTRADIOL 0.1 MG/GM VA CREA
0.5000 g | TOPICAL_CREAM | VAGINAL | 3 refills | Status: AC
  Filled 2024-01-15: qty 42.5, 90d supply, fill #0
  Filled 2024-08-18: qty 42.5, 90d supply, fill #1

## 2024-01-15 MED ORDER — CLOBETASOL PROPIONATE 0.05 % EX OINT
1.0000 | TOPICAL_OINTMENT | Freq: Two times a day (BID) | CUTANEOUS | 3 refills | Status: AC
  Filled 2024-01-15: qty 45, 23d supply, fill #0
  Filled 2024-02-07 – 2024-11-01 (×3): qty 45, 23d supply, fill #1

## 2024-01-26 ENCOUNTER — Telehealth: Payer: Self-pay

## 2024-01-26 NOTE — Telephone Encounter (Signed)
Copied from CRM [id number]. Topic: Clinical - Request for Lab/Test Order >> Jan 26, 2024 12:00 PM Theodis Sato wrote: Reason for CRM: Patient is requesting a chest x-ray due to a positive TB test. Patient states she always tests positive because of her Bcg vaccine she received as a child. Patient states she needs this for school.

## 2024-01-27 ENCOUNTER — Other Ambulatory Visit: Payer: Self-pay | Admitting: Family

## 2024-01-27 DIAGNOSIS — Z111 Encounter for screening for respiratory tuberculosis: Secondary | ICD-10-CM

## 2024-01-27 NOTE — Telephone Encounter (Signed)
 Mychart message has been sent to pt.

## 2024-02-06 ENCOUNTER — Ambulatory Visit (HOSPITAL_BASED_OUTPATIENT_CLINIC_OR_DEPARTMENT_OTHER)
Admission: RE | Admit: 2024-02-06 | Discharge: 2024-02-06 | Disposition: A | Discharge status: Home / Self Care | Payer: Commercial Managed Care - PPO | Source: Ambulatory Visit | Attending: Family | Admitting: Family

## 2024-02-06 DIAGNOSIS — Z111 Encounter for screening for respiratory tuberculosis: Secondary | ICD-10-CM | POA: Diagnosis not present

## 2024-02-06 DIAGNOSIS — Z8611 Personal history of tuberculosis: Secondary | ICD-10-CM | POA: Diagnosis not present

## 2024-02-07 ENCOUNTER — Other Ambulatory Visit: Payer: Self-pay | Admitting: Family

## 2024-02-09 ENCOUNTER — Other Ambulatory Visit: Payer: Self-pay

## 2024-02-10 ENCOUNTER — Other Ambulatory Visit (HOSPITAL_BASED_OUTPATIENT_CLINIC_OR_DEPARTMENT_OTHER): Payer: Self-pay

## 2024-02-10 MED ORDER — DICLOFENAC SODIUM 1 % EX GEL
4.0000 g | Freq: Four times a day (QID) | CUTANEOUS | 0 refills | Status: DC | PRN
  Filled 2024-02-10 – 2024-08-18 (×2): qty 100, 7d supply, fill #0

## 2024-02-20 ENCOUNTER — Other Ambulatory Visit (HOSPITAL_BASED_OUTPATIENT_CLINIC_OR_DEPARTMENT_OTHER): Payer: Self-pay

## 2024-02-24 ENCOUNTER — Encounter: Payer: Self-pay | Admitting: Family

## 2024-08-18 ENCOUNTER — Other Ambulatory Visit (HOSPITAL_COMMUNITY): Payer: Self-pay

## 2024-08-18 ENCOUNTER — Other Ambulatory Visit: Payer: Self-pay

## 2024-08-18 ENCOUNTER — Encounter: Payer: Self-pay | Admitting: Pharmacist

## 2024-08-24 ENCOUNTER — Other Ambulatory Visit: Payer: Self-pay

## 2024-09-21 ENCOUNTER — Encounter: Payer: Commercial Managed Care - PPO | Admitting: Family

## 2024-09-21 ENCOUNTER — Encounter: Admitting: Family Medicine

## 2024-09-23 ENCOUNTER — Encounter: Admitting: Family Medicine

## 2024-09-29 ENCOUNTER — Ambulatory Visit (INDEPENDENT_AMBULATORY_CARE_PROVIDER_SITE_OTHER): Admitting: Student

## 2024-09-29 ENCOUNTER — Other Ambulatory Visit (HOSPITAL_BASED_OUTPATIENT_CLINIC_OR_DEPARTMENT_OTHER): Payer: Self-pay

## 2024-09-29 ENCOUNTER — Encounter: Payer: Self-pay | Admitting: Student

## 2024-09-29 VITALS — BP 112/80 | HR 55 | Temp 98.0°F | Resp 18 | Ht 65.0 in | Wt 199.4 lb

## 2024-09-29 DIAGNOSIS — R7303 Prediabetes: Secondary | ICD-10-CM | POA: Insufficient documentation

## 2024-09-29 DIAGNOSIS — E559 Vitamin D deficiency, unspecified: Secondary | ICD-10-CM

## 2024-09-29 DIAGNOSIS — F419 Anxiety disorder, unspecified: Secondary | ICD-10-CM | POA: Insufficient documentation

## 2024-09-29 DIAGNOSIS — Z0000 Encounter for general adult medical examination without abnormal findings: Secondary | ICD-10-CM | POA: Diagnosis not present

## 2024-09-29 DIAGNOSIS — Z1231 Encounter for screening mammogram for malignant neoplasm of breast: Secondary | ICD-10-CM | POA: Diagnosis not present

## 2024-09-29 MED ORDER — ONDANSETRON 4 MG PO TBDP
4.0000 mg | ORAL_TABLET | Freq: Four times a day (QID) | ORAL | 0 refills | Status: AC | PRN
  Filled 2024-09-29: qty 15, 4d supply, fill #0

## 2024-09-29 MED ORDER — DICLOFENAC SODIUM 1 % EX GEL
4.0000 g | Freq: Four times a day (QID) | CUTANEOUS | 0 refills | Status: AC | PRN
  Filled 2024-09-29: qty 100, 7d supply, fill #0

## 2024-09-29 MED ORDER — FLUZONE 0.5 ML IM SUSY
0.5000 mL | PREFILLED_SYRINGE | Freq: Once | INTRAMUSCULAR | 0 refills | Status: AC
  Filled 2024-09-29: qty 0.5, 1d supply, fill #0

## 2024-09-29 NOTE — Assessment & Plan Note (Signed)
 Supplement and monitor

## 2024-09-29 NOTE — Assessment & Plan Note (Addendum)
 Hydroxyzine  25 mg TID as needed for anxiety; patient uses this medication very sparingly. Reports history of claustrophobia.  She reports that anxiety does not interfere with her daily functioning and that she manages symptoms well. She avoids situations involving small or confined spaces, but this does not significantly impact her quality of life, managing well.

## 2024-09-29 NOTE — Progress Notes (Signed)
 Subjective:     Patient ID: Cheryl Bell, female    DOB: 12-May-1966, 58 y.o.   MRN: [id number]  Chief Complaint  Patient presents with   Annual Exam    Pt states fasting     HPI  Discussed the use of AI scribe software for clinical note transcription with the patient.   Cheryl Bell  is a 58 yo female  Presents for [facility] and Annual Physical. 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 She has three children - one recently graduated from college, one son who is a Neurosurgeon at [employer], and a 4 year old daughter attending [employer]. She reports efforts to eat a healthier diet but has experienced difficulty losing weight despite increasing her physical activity. She acknowledges that her sleep could improve due to the demands of school and studying. She denies tobacco use and reports occasional alcohol consumption.Goes to dentist regularly. Denies vision issues.   Anxiety-hydroxyzine  25 mg every 8 hours as needed Uses sparingly, does not need refill today.  Vitamin D deficiency-5000 units vitamin D daily  Patient denies fever, chills, SOB, CP, palpitations, dyspnea, edema, HA, vision changes, N/V/D, abdominal pain, urinary symptoms, rash, weight changes, and recent illness or hospitalizations.     History of Present Illness              Health Maintenance Due  Topic Date Due   Hepatitis B Vaccines 19-59 Average Risk (1 of 3 - 19+ 3-dose series) Never done   Zoster Vaccines- Shingrix (1 of 2) Never done   Pneumococcal Vaccine: 50+ Years (1 of 1 - PCV) Never done   COVID-19 Vaccine (3 - Moderna risk series) 02/22/2020   Mammogram  11/23/2023    Past Medical History:  Diagnosis Date   Anemia    Arthritis    Dyspnea    GERD (gastroesophageal reflux disease)    PONV (postoperative nausea and vomiting)      Past Surgical History:  Procedure Laterality Date   ABDOMINAL HYSTERECTOMY Left 05/03/2019   Procedure: HYSTERECTOMY ABDOMINAL with left salpingectomy;  Surgeon: Mat Browning, MD;  Location: [facility] OR;  Service: Gynecology;  Laterality: Left;   BTL     CHOLECYSTECTOMY  2007   TUBAL LIGATION  2008    Family History  Problem Relation Age of Onset   Prostate cancer Brother    Prostate cancer Brother    Healthy Daughter    Healthy Daughter    Healthy Son    Healthy Son    Sleep apnea Neg Hx     Social History   Socioeconomic History   Marital status: Single    Spouse name: Not on file   Number of children: Not on file   Years of education: Not on file   Highest education level: Not on file  Occupational History   Not on file  Tobacco Use   Smoking status: Never   Smokeless tobacco: Never  Vaping Use   Vaping status: Never Used  Substance and Sexual Activity   Alcohol use: Yes    Comment: occ   Drug use: Never   Sexual activity: Not on file  Other Topics Concern   Not on file  Social History Narrative   Not on file   Social Drivers of Health   Financial Resource Strain: Not on file  Food Insecurity: Not  on file  Transportation Needs: Not on file  Physical Activity: Not on file  Stress: Not on file  Social Connections: Not on file  Intimate Partner Violence: Not on file    Outpatient Medications Prior to Visit  Medication Sig Dispense Refill   Ascorbic Acid (VITAMIN C) 1000 MG tablet Take 1,000 mg by mouth daily.     b complex vitamins tablet Take 1 tablet by mouth daily.     Biotin 89999 MCG TABS Take 10,000 mcg by mouth daily.     cetirizine (ZYRTEC) 10 MG tablet Take 10 mg by mouth daily as needed for allergies.     Cholecalciferol (VITAMIN D3) 125 MCG (5000 UT) CAPS Take 5,000 Units by mouth daily.     clobetasol  ointment (TEMOVATE ) 0.05 % Apply 1 thin layer Application to affected area(s) topically 2 (two) times daily. 45 g 3   diclofenac  Sodium  (VOLTAREN ) 1 % GEL Apply 4 g topically 4 (four) times daily as needed. 100 g 0   diphenhydrAMINE (BENADRYL) 25 MG tablet Take 50 mg by mouth as needed for itching.     estradiol  (ESTRACE ) 0.1 MG/GM vaginal cream Place 0.5 g vaginally 3 times weekly as directed 127.5 g 3   hydrOXYzine  (VISTARIL ) 25 MG capsule Take 1 capsule (25 mg total) by mouth every 8 (eight) hours as needed for anxiety. 30 capsule 0   ibuprofen  (ADVIL ) 200 MG tablet Take 800 mg by mouth as needed for headache or moderate pain.     Multiple Vitamin (MULTI VITAMIN DAILY PO) Take 1 tablet by mouth daily.      omeprazole  (PRILOSEC) 20 MG capsule Take 1 capsule (20 mg total) by mouth daily. 90 capsule 1   ondansetron  (ZOFRAN -ODT) 4 MG disintegrating tablet Take 1 tablet (4 mg total) by mouth every 6 (six) hours as needed for nausea or vomiting. 15 tablet 0   Turmeric (QC TUMERIC COMPLEX PO) Take by mouth in the morning and at bedtime.     vitamin E 400 UNIT capsule Take 400 Units by mouth daily.     No facility-administered medications prior to visit.    Allergies  Allergen Reactions   Prednisone Palpitations   Vicodin [Hydrocodone-Acetaminophen ] Nausea And Vomiting    ROS See HPI    Objective:    Physical Exam Constitutional:      General: She is not in acute distress.    Appearance: She is not ill-appearing, toxic-appearing or diaphoretic.  HENT:     Head: Normocephalic and atraumatic.     Right Ear: Tympanic membrane, ear canal and external ear normal.     Left Ear: Tympanic membrane, ear canal and external ear normal.     Nose: Nose normal. No congestion.     Mouth/Throat:     Mouth: Mucous membranes are moist.     Pharynx: Oropharynx is clear.  Eyes:     Extraocular Movements: Extraocular movements intact.     Right eye: Normal extraocular motion.     Left eye: Normal extraocular motion.     Conjunctiva/sclera: Conjunctivae normal.     Pupils: Pupils are equal, round, and reactive to light.  Neck:      Thyroid : No thyroid  mass or thyromegaly.     Vascular: No carotid bruit or JVD.  Cardiovascular:     Rate and Rhythm: Normal rate and regular rhythm.     Pulses: Normal pulses.          Radial pulses are 2+ on the right side and  2+ on the left side.       Dorsalis pedis pulses are 2+ on the right side and 2+ on the left side.     Heart sounds: Normal heart sounds, S1 normal and S2 normal. No murmur heard.    No friction rub. No gallop.  Pulmonary:     Effort: Pulmonary effort is normal. No respiratory distress.     Breath sounds: Normal breath sounds.  Abdominal:     General: Bowel sounds are normal. There is no distension.     Palpations: Abdomen is soft.     Tenderness: There is no abdominal tenderness. There is no guarding.  Musculoskeletal:        General: Normal range of motion.     Cervical back: Full passive range of motion without pain and normal range of motion. No edema or erythema.     Right lower leg: No edema.     Left lower leg: No edema.  Lymphadenopathy:     Cervical: No cervical adenopathy.  Skin:    General: Skin is warm and dry.     Capillary Refill: Capillary refill takes less than 2 seconds.  Neurological:     General: No focal deficit present.     Mental Status: She is alert and oriented to person, place, and time.     Cranial Nerves: No cranial nerve deficit.     Motor: No weakness.     Coordination: Coordination normal.     Gait: Gait normal.     Deep Tendon Reflexes: Reflexes normal.  Psychiatric:        Mood and Affect: Mood normal.        Behavior: Behavior normal.        Thought Content: Thought content normal.      BP 112/80 (BP Location: Left Arm, Patient Position: Sitting, Cuff Size: Large)   Pulse (!) 55   Temp 98 F (36.7 C) (Oral)   Resp 18   Ht 5' 5 (1.651 m)   Wt 199 lb 6.4 oz (90.4 kg)   SpO2 99%   BMI 33.18 kg/m  Wt Readings from Last 3 Encounters:  09/29/24 199 lb 6.4 oz (90.4 kg)  10/17/23 199 lb (90.3 kg)  09/19/23 199  lb (90.3 kg)       Assessment & Plan:   Problem List Items Addressed This Visit     Anxiety   Hydroxyzine  25 mg TID as needed for anxiety; patient uses this medication very sparingly. Reports history of claustrophobia.  She reports that anxiety does not interfere with her daily functioning and that she manages symptoms well. She avoids situations involving small or confined spaces, but this does not significantly impact her quality of life, managing well.      Relevant Orders   TSH   Prediabetes   hgba1c acceptable, minimize simple carbs. Increase exercise as tolerated.       Relevant Orders   HgB A1c   Preventative health care - Primary   Patient encouraged to maintain heart healthy diet, regular exercise, adequate sleep. Consider daily probiotics.     HCM Mgm & Pap: Follows with GYN- [facility]- Dr. Rosaline Cobble, request records CRC screen: due 2030 Immunizations: Receiving FLU shot at work; Shingles and PNA to receive at pharmacy      Vitamin D deficiency   Supplement and monitor.      Relevant Orders   Vitamin D (25 hydroxy)   Other Visit Diagnoses  Encounter for screening mammogram for malignant neoplasm of breast       Relevant Orders   MM 3D SCREENING MAMMOGRAM BILATERAL BREAST          I am having Cheryl Bell maintain her Multiple Vitamin (MULTI VITAMIN DAILY PO), vitamin E, vitamin C, b complex vitamins, Biotin, Vitamin D3, ibuprofen , cetirizine, diphenhydrAMINE, omeprazole , Turmeric (QC TUMERIC COMPLEX PO), ondansetron , hydrOXYzine , clobetasol  ointment, estradiol , and diclofenac  Sodium.  No orders of the defined types were placed in this encounter.

## 2024-09-29 NOTE — Assessment & Plan Note (Addendum)
 Patient encouraged to maintain heart healthy diet, regular exercise, adequate sleep. Consider daily probiotics.     HCM Mgm & Pap: Follows with GYN- [facility]- Dr. Rosaline Cobble, request records CRC screen: due 2030 Immunizations: Receiving FLU shot at work; Shingles and PNA to receive at pharmacy

## 2024-09-29 NOTE — Addendum Note (Signed)
 Addended by: WHEELER HARLENE CROME on: 09/29/2024 02:23 PM   Modules accepted: Orders

## 2024-09-29 NOTE — Assessment & Plan Note (Signed)
 hgba1c acceptable, minimize simple carbs. Increase exercise as tolerated.

## 2024-09-30 ENCOUNTER — Ambulatory Visit: Payer: Self-pay | Admitting: Student

## 2024-09-30 ENCOUNTER — Other Ambulatory Visit (HOSPITAL_BASED_OUTPATIENT_CLINIC_OR_DEPARTMENT_OTHER): Payer: Self-pay

## 2024-09-30 DIAGNOSIS — E559 Vitamin D deficiency, unspecified: Secondary | ICD-10-CM

## 2024-09-30 LAB — COMPREHENSIVE METABOLIC PANEL WITH GFR
ALT: 30 U/L (ref 0–35)
AST: 27 U/L (ref 0–37)
Albumin: 4.2 g/dL (ref 3.5–5.2)
Alkaline Phosphatase: 51 U/L (ref 39–117)
BUN: 9 mg/dL (ref 6–23)
CO2: 29 meq/L (ref 19–32)
Calcium: 9.2 mg/dL (ref 8.4–10.5)
Chloride: 105 meq/L (ref 96–112)
Creatinine, Ser: 0.71 mg/dL (ref 0.40–1.20)
GFR: 95.09 mL/min (ref >60.00)
Glucose, Bld: 91 mg/dL (ref 70–99)
Potassium: 4.1 meq/L (ref 3.5–5.1)
Sodium: 141 meq/L (ref 135–145)
Total Bilirubin: 0.5 mg/dL (ref 0.2–1.2)
Total Protein: 7 g/dL (ref 6.0–8.3)

## 2024-09-30 LAB — LIPID PANEL
Cholesterol: 172 mg/dL (ref 0–200)
HDL: 45.6 mg/dL (ref >39.00)
LDL Cholesterol: 102 mg/dL — ABNORMAL HIGH (ref 0–99)
NonHDL: 126.7
Total CHOL/HDL Ratio: 4
Triglycerides: 124 mg/dL (ref 0.0–149.0)
VLDL: 24.8 mg/dL (ref 0.0–40.0)

## 2024-09-30 LAB — CBC WITH DIFFERENTIAL/PLATELET
Basophils Absolute: 0 K/uL (ref 0.0–0.1)
Basophils Relative: 0.7 % (ref 0.0–3.0)
Eosinophils Absolute: 0.2 K/uL (ref 0.0–0.7)
Eosinophils Relative: 5.5 % — ABNORMAL HIGH (ref 0.0–5.0)
HCT: 38.7 % (ref 36.0–46.0)
Hemoglobin: 13 g/dL (ref 12.0–15.0)
Lymphocytes Relative: 56.2 % — ABNORMAL HIGH (ref 12.0–46.0)
Lymphs Abs: 2.1 K/uL (ref 0.7–4.0)
MCHC: 33.5 g/dL (ref 30.0–36.0)
MCV: 91.6 fl (ref 78.0–100.0)
Monocytes Absolute: 0.4 K/uL (ref 0.1–1.0)
Monocytes Relative: 9.4 % (ref 3.0–12.0)
Neutro Abs: 1.1 K/uL — ABNORMAL LOW (ref 1.4–7.7)
Neutrophils Relative %: 28.2 % — ABNORMAL LOW (ref 43.0–77.0)
Platelets: 251 K/uL (ref 150.0–400.0)
RBC: 4.23 Mil/uL (ref 3.87–5.11)
RDW: 12.5 % (ref 11.5–15.5)
WBC: 3.8 K/uL — ABNORMAL LOW (ref 4.0–10.5)

## 2024-09-30 LAB — VITAMIN D 25 HYDROXY (VIT D DEFICIENCY, FRACTURES): VITD: 19.49 ng/mL — ABNORMAL LOW (ref 30.00–100.00)

## 2024-09-30 LAB — HEMOGLOBIN A1C: Hgb A1c MFr Bld: 6 % (ref 4.6–6.5)

## 2024-09-30 LAB — TSH: TSH: 1.31 u[IU]/mL (ref 0.35–5.50)

## 2024-09-30 MED ORDER — VITAMIN D (ERGOCALCIFEROL) 1.25 MG (50000 UNIT) PO CAPS
50000.0000 [IU] | ORAL_CAPSULE | ORAL | 3 refills | Status: DC
  Filled 2024-09-30: qty 4, 28d supply, fill #0

## 2024-10-05 ENCOUNTER — Telehealth: Payer: Self-pay | Admitting: Student

## 2024-10-05 NOTE — Telephone Encounter (Signed)
Form placed in pcp folder

## 2024-10-05 NOTE — Telephone Encounter (Signed)
 Pt dropped off paper from visit last week. Pt wants to pick for up when ready please notify pt via phone call. Placed form in pcp's bin in front office.

## 2024-10-05 NOTE — Telephone Encounter (Signed)
 Also looks like pt's cpe was scheduled wrong for next year. Can you let the pt kow to r/s that when form is picked up?

## 2024-10-07 ENCOUNTER — Telehealth: Payer: Self-pay

## 2024-10-07 DIAGNOSIS — R7989 Other specified abnormal findings of blood chemistry: Secondary | ICD-10-CM

## 2024-10-07 NOTE — Telephone Encounter (Signed)
 LMOM informing Pt that CBC w/ diff has been added to labs for January.

## 2024-10-07 NOTE — Addendum Note (Signed)
 Addended by: WHEELER HARLENE CROME on: 10/07/2024 04:18 PM   Modules accepted: Orders

## 2024-10-07 NOTE — Telephone Encounter (Signed)
 See my chart message

## 2024-10-07 NOTE — Telephone Encounter (Signed)
 Copied from CRM [id number]. Topic: Clinical - Request for Lab/Test Order >> Oct 07, 2024 10:34 AM Tinnie BROCKS wrote: Reason for CRM: Pt has lab scheduled in January to check vit D, but she also wants to have a cbc diff to check her lymphocytes. She states these were off on her last labs and she wants to make sure they are good. Requesting cbc diff order.

## 2024-10-08 NOTE — Progress Notes (Deleted)
 Cardiology Office Note:    Date:  10/08/2024   ID:  COSETTE Bell, DOB 11-Jan-1966, MRN [id number]  PCP:  Wheeler Harlene CROME, NP   [facility] Providers Cardiologist:  None { Click to update primary MD,subspecialty MD or APP then REFRESH:1}    Referring MD: Jason Leita Repine,*   No chief complaint on file. ***  History of Present Illness:    Cheryl Bell is a 58 y.o. female seen at the request of Leita Repine FNP for evaluation of dyspnea and dizziness.   Past Medical History:  Diagnosis Date   Anemia    Arthritis    Dyspnea    GERD (gastroesophageal reflux disease)    PONV (postoperative nausea and vomiting)     Past Surgical History:  Procedure Laterality Date   ABDOMINAL HYSTERECTOMY Left 05/03/2019   Procedure: HYSTERECTOMY ABDOMINAL with left salpingectomy;  Surgeon: Mat Browning, MD;  Location: [facility] OR;  Service: Gynecology;  Laterality: Left;   BTL     CHOLECYSTECTOMY  2007   TUBAL LIGATION  2008    Current Medications: No outpatient medications have been marked as taking for the 10/12/24 encounter (Appointment) with [place], Aparna Vanderweele M, MD.     Allergies:   Prednisone and Vicodin [hydrocodone-acetaminophen ]   Social History   Socioeconomic History   Marital status: Single    Spouse name: Not on file   Number of children: Not on file   Years of education: Not on file   Highest education level: Not on file  Occupational History   Not on file  Tobacco Use   Smoking status: Never   Smokeless tobacco: Never  Vaping Use   Vaping status: Never Used  Substance and Sexual Activity   Alcohol use: Yes    Comment: occ   Drug use: Never   Sexual activity: Not on file  Other Topics Concern   Not on file  Social History Narrative   Not on file   Social Drivers of Health   Financial Resource Strain: Not on file  Food Insecurity: Not on file  Transportation Needs: Not on file  Physical Activity: Not on file  Stress: Not on file   Social Connections: Not on file     Family History: The patient's ***family history includes Healthy in her daughter, daughter, son, and son; Prostate cancer in her brother and brother. There is no history of Sleep apnea.  ROS:   Please see the history of present illness.    *** All other systems reviewed and are negative.  EKGs/Labs/Other Studies Reviewed:    The following studies were reviewed today: ***      Recent Labs: 09/29/2024: ALT 30; BUN 9; Creatinine, Ser 0.71; Hemoglobin 13.0; Platelets 251.0; Potassium 4.1; Sodium 141; TSH 1.31  Recent Lipid Panel    Component Value Date/Time   CHOL 172 09/29/2024 1347   TRIG 124.0 09/29/2024 1347   HDL 45.60 09/29/2024 1347   CHOLHDL 4 09/29/2024 1347   VLDL 24.8 09/29/2024 1347   LDLCALC 102 (H) 09/29/2024 1347   LDLCALC 109 (H) 09/19/2023 1349     Risk Assessment/Calculations:   {Does this patient have ATRIAL FIBRILLATION?[phone]}  No BP recorded.  {Refresh Note OR Click here to enter BP  :1}***         Physical Exam:    VS:  There were no vitals taken for this visit.    Wt Readings from Last 3 Encounters:  09/29/24 199 lb 6.4 oz (90.4 kg)  10/17/23  199 lb (90.3 kg)  09/19/23 199 lb (90.3 kg)     GEN: *** Well nourished, well developed in no acute distress HEENT: Normal NECK: No JVD; No carotid bruits LYMPHATICS: No lymphadenopathy CARDIAC: ***RRR, no murmurs, rubs, gallops RESPIRATORY:  Clear to auscultation without rales, wheezing or rhonchi  ABDOMEN: Soft, non-tender, non-distended MUSCULOSKELETAL:  No edema; No deformity  SKIN: Warm and dry NEUROLOGIC:  Alert and oriented x 3 PSYCHIATRIC:  Normal affect   ASSESSMENT:    No diagnosis found. PLAN:    In order of problems listed above:  ***      {Are you ordering a CV Procedure (e.g. stress test, cath, DCCV, TEE, etc)?   Press F2        :[id number]}    Medication Adjustments/Labs and Tests Ordered: Current medicines are reviewed at length  with the patient today.  Concerns regarding medicines are outlined above.  No orders of the defined types were placed in this encounter.  No orders of the defined types were placed in this encounter.   There are no Patient Instructions on file for this visit.   Signed, Dishawn Bhargava [place], MD  10/08/2024 6:04 PM    [facility]

## 2024-10-12 ENCOUNTER — Ambulatory Visit: Attending: Cardiology | Admitting: Cardiology

## 2024-10-12 ENCOUNTER — Telehealth (HOSPITAL_BASED_OUTPATIENT_CLINIC_OR_DEPARTMENT_OTHER): Payer: Self-pay

## 2024-10-13 ENCOUNTER — Encounter: Payer: Self-pay | Admitting: Cardiology

## 2024-11-01 ENCOUNTER — Other Ambulatory Visit (HOSPITAL_BASED_OUTPATIENT_CLINIC_OR_DEPARTMENT_OTHER): Payer: Self-pay

## 2024-11-16 NOTE — Progress Notes (Deleted)
 Cardiology Office Note:    Date:  11/16/2024   ID:  HORACE WISHON, DOB 03-Feb-1966, MRN [id number]  PCP:  Wheeler Harlene CROME, NP   [facility] Providers Cardiologist:  None { Click to update primary MD,subspecialty MD or APP then REFRESH:1}    Referring MD: Jason Leita Repine,*   No chief complaint on file. ***  History of Present Illness:    LENISE JR is a 58 y.o. female is seen at the request of Leita Repine FNP for evaluation of SOB and dizziness.   Past Medical History:  Diagnosis Date   Anemia    Arthritis    Dyspnea    GERD (gastroesophageal reflux disease)    PONV (postoperative nausea and vomiting)     Past Surgical History:  Procedure Laterality Date   ABDOMINAL HYSTERECTOMY Left 05/03/2019   Procedure: HYSTERECTOMY ABDOMINAL with left salpingectomy;  Surgeon: Mat Browning, MD;  Location: [facility] OR;  Service: Gynecology;  Laterality: Left;   BTL     CHOLECYSTECTOMY  2007   TUBAL LIGATION  2008    Current Medications: No outpatient medications have been marked as taking for the 11/22/24 encounter (Appointment) with Elizaveta Mattice M, MD.     Allergies:   Prednisone and Vicodin [hydrocodone-acetaminophen ]   Social History   Socioeconomic History   Marital status: Single    Spouse name: Not on file   Number of children: Not on file   Years of education: Not on file   Highest education level: Not on file  Occupational History   Not on file  Tobacco Use   Smoking status: Never   Smokeless tobacco: Never  Vaping Use   Vaping status: Never Used  Substance and Sexual Activity   Alcohol use: Yes    Comment: occ   Drug use: Never   Sexual activity: Not on file  Other Topics Concern   Not on file  Social History Narrative   Not on file   Social Drivers of Health   Financial Resource Strain: Not on file  Food Insecurity: Not on file  Transportation Needs: Not on file  Physical Activity: Not on file  Stress: Not on file   Social Connections: Not on file     Family History: The patient's ***family history includes Healthy in her daughter, daughter, son, and son; Prostate cancer in her brother and brother. There is no history of Sleep apnea.  ROS:   Please see the history of present illness.    *** All other systems reviewed and are negative.  EKGs/Labs/Other Studies Reviewed:    The following studies were reviewed today: ***      Recent Labs: 09/29/2024: ALT 30; BUN 9; Creatinine, Ser 0.71; Hemoglobin 13.0; Platelets 251.0; Potassium 4.1; Sodium 141; TSH 1.31  Recent Lipid Panel    Component Value Date/Time   CHOL 172 09/29/2024 1347   TRIG 124.0 09/29/2024 1347   HDL 45.60 09/29/2024 1347   CHOLHDL 4 09/29/2024 1347   VLDL 24.8 09/29/2024 1347   LDLCALC 102 (H) 09/29/2024 1347   LDLCALC 109 (H) 09/19/2023 1349     Risk Assessment/Calculations:   {Does this patient have ATRIAL FIBRILLATION?:[phone]}  No BP recorded.  {Refresh Note OR Click here to enter BP  :1}***         Physical Exam:    VS:  There were no vitals taken for this visit.    Wt Readings from Last 3 Encounters:  09/29/24 199 lb 6.4 oz (90.4 kg)  10/17/23 199 lb (90.3 kg)  09/19/23 199 lb (90.3 kg)     GEN: *** Well nourished, well developed in no acute distress HEENT: Normal NECK: No JVD; No carotid bruits LYMPHATICS: No lymphadenopathy CARDIAC: ***RRR, no murmurs, rubs, gallops RESPIRATORY:  Clear to auscultation without rales, wheezing or rhonchi  ABDOMEN: Soft, non-tender, non-distended MUSCULOSKELETAL:  No edema; No deformity  SKIN: Warm and dry NEUROLOGIC:  Alert and oriented x 3 PSYCHIATRIC:  Normal affect   ASSESSMENT:    No diagnosis found. PLAN:    In order of problems listed above:  ***      {Are you ordering a CV Procedure (e.g. stress test, cath, DCCV, TEE, etc)?   Press F2        :[id number]}    Medication Adjustments/Labs and Tests Ordered: Current medicines are reviewed at length  with the patient today.  Concerns regarding medicines are outlined above.  No orders of the defined types were placed in this encounter.  No orders of the defined types were placed in this encounter.   There are no Patient Instructions on file for this visit.   Signed, Eitan Doubleday, MD  11/16/2024 4:47 PM    [facility]

## 2024-11-22 ENCOUNTER — Ambulatory Visit: Attending: Cardiology | Admitting: Cardiology

## 2024-11-23 ENCOUNTER — Encounter: Payer: Self-pay | Admitting: Cardiology

## 2024-12-30 ENCOUNTER — Telehealth: Payer: Self-pay | Admitting: *Deleted

## 2024-12-30 ENCOUNTER — Other Ambulatory Visit (INDEPENDENT_AMBULATORY_CARE_PROVIDER_SITE_OTHER)

## 2024-12-30 DIAGNOSIS — R7989 Other specified abnormal findings of blood chemistry: Secondary | ICD-10-CM | POA: Diagnosis not present

## 2024-12-30 DIAGNOSIS — E559 Vitamin D deficiency, unspecified: Secondary | ICD-10-CM | POA: Diagnosis not present

## 2024-12-30 LAB — CBC WITH DIFFERENTIAL/PLATELET
Basophils Absolute: 0 K/uL (ref 0.0–0.1)
Basophils Relative: 1.2 % (ref 0.0–3.0)
Eosinophils Absolute: 0.2 K/uL (ref 0.0–0.7)
Eosinophils Relative: 5.7 % — ABNORMAL HIGH (ref 0.0–5.0)
HCT: 40.1 % (ref 36.0–46.0)
Hemoglobin: 13.6 g/dL (ref 12.0–15.0)
Lymphocytes Relative: 54.3 % — ABNORMAL HIGH (ref 12.0–46.0)
Lymphs Abs: 1.8 K/uL (ref 0.7–4.0)
MCHC: 34 g/dL (ref 30.0–36.0)
MCV: 92.4 fl (ref 78.0–100.0)
Monocytes Absolute: 0.4 K/uL (ref 0.1–1.0)
Monocytes Relative: 10.9 % (ref 3.0–12.0)
Neutro Abs: 0.9 K/uL — ABNORMAL LOW (ref 1.4–7.7)
Neutrophils Relative %: 27.9 % — ABNORMAL LOW (ref 43.0–77.0)
Platelets: 215 K/uL (ref 150.0–400.0)
RBC: 4.34 Mil/uL (ref 3.87–5.11)
RDW: 12.8 % (ref 11.5–15.5)
WBC: 3.3 K/uL — ABNORMAL LOW (ref 4.0–10.5)

## 2024-12-30 LAB — VITAMIN D 25 HYDROXY (VIT D DEFICIENCY, FRACTURES): VITD: 27 ng/mL — ABNORMAL LOW (ref 30.00–100.00)

## 2024-12-30 NOTE — Telephone Encounter (Signed)
 Pt was in for labs this morning and wanted to know if a CMP could be added on to check her liver.  CMP was last checked in Oct and liver numbers were normal.  Please advise.

## 2024-12-31 ENCOUNTER — Ambulatory Visit: Payer: Self-pay | Admitting: Student

## 2024-12-31 ENCOUNTER — Encounter: Payer: Self-pay | Admitting: Student

## 2024-12-31 ENCOUNTER — Other Ambulatory Visit (HOSPITAL_BASED_OUTPATIENT_CLINIC_OR_DEPARTMENT_OTHER): Payer: Self-pay

## 2024-12-31 DIAGNOSIS — E559 Vitamin D deficiency, unspecified: Secondary | ICD-10-CM

## 2024-12-31 DIAGNOSIS — D72829 Elevated white blood cell count, unspecified: Secondary | ICD-10-CM

## 2024-12-31 MED ORDER — VITAMIN D (ERGOCALCIFEROL) 1.25 MG (50000 UNIT) PO CAPS
50000.0000 [IU] | ORAL_CAPSULE | ORAL | 3 refills | Status: AC
  Filled 2024-12-31: qty 4, 28d supply, fill #0

## 2025-01-03 NOTE — Telephone Encounter (Signed)
 Faxed over request for add on

## 2025-09-29 ENCOUNTER — Encounter: Admitting: Student
